# Patient Record
Sex: Male | Born: 1955 | Race: Black or African American | Hispanic: No | Smoking: Former smoker
Health system: Southern US, Community
[De-identification: ages and names within clinical notes are randomized; demographics above are authoritative.]

## PROBLEM LIST (undated history)

## (undated) DIAGNOSIS — D649 Anemia, unspecified: Secondary | ICD-10-CM

## (undated) DIAGNOSIS — J449 Chronic obstructive pulmonary disease, unspecified: Secondary | ICD-10-CM

## (undated) DIAGNOSIS — G629 Polyneuropathy, unspecified: Secondary | ICD-10-CM

## (undated) DIAGNOSIS — Z9981 Dependence on supplemental oxygen: Secondary | ICD-10-CM

## (undated) DIAGNOSIS — D696 Thrombocytopenia, unspecified: Secondary | ICD-10-CM

---

## 2009-01-09 ENCOUNTER — Emergency Department (HOSPITAL_COMMUNITY): Admission: EM | Admit: 2009-01-09 | Discharge: 2009-01-09 | Payer: Self-pay | Admitting: Emergency Medicine

## 2013-08-31 ENCOUNTER — Emergency Department (HOSPITAL_COMMUNITY): Payer: Self-pay

## 2013-08-31 ENCOUNTER — Encounter (HOSPITAL_COMMUNITY): Payer: Self-pay | Admitting: Emergency Medicine

## 2013-08-31 ENCOUNTER — Emergency Department (HOSPITAL_COMMUNITY)
Admission: EM | Admit: 2013-08-31 | Discharge: 2013-08-31 | Disposition: A | Discharge status: Home / Self Care | Payer: Self-pay | Attending: Emergency Medicine | Admitting: Emergency Medicine

## 2013-08-31 DIAGNOSIS — F17200 Nicotine dependence, unspecified, uncomplicated: Secondary | ICD-10-CM | POA: Insufficient documentation

## 2013-08-31 DIAGNOSIS — J9801 Acute bronchospasm: Secondary | ICD-10-CM

## 2013-08-31 DIAGNOSIS — J449 Chronic obstructive pulmonary disease, unspecified: Secondary | ICD-10-CM

## 2013-08-31 DIAGNOSIS — J441 Chronic obstructive pulmonary disease with (acute) exacerbation: Secondary | ICD-10-CM | POA: Insufficient documentation

## 2013-08-31 LAB — BASIC METABOLIC PANEL
BUN: 17 mg/dL (ref 6–23)
CALCIUM: 9.4 mg/dL (ref 8.4–10.5)
CO2: 31 meq/L (ref 19–32)
CREATININE: 1.04 mg/dL (ref 0.50–1.35)
Chloride: 104 mEq/L (ref 96–112)
GFR calc Af Amer: 90 mL/min — ABNORMAL LOW (ref >90)
GFR, EST NON AFRICAN AMERICAN: 77 mL/min — AB (ref >90)
GLUCOSE: 138 mg/dL — AB (ref 70–99)
Potassium: 4.2 mEq/L (ref 3.7–5.3)
SODIUM: 145 meq/L (ref 137–147)

## 2013-08-31 LAB — CBC WITH DIFFERENTIAL/PLATELET
BASOS ABS: 0 10*3/uL (ref 0.0–0.1)
BASOS PCT: 1 % (ref 0–1)
EOS ABS: 0.3 10*3/uL (ref 0.0–0.7)
Eosinophils Relative: 5 % (ref 0–5)
HCT: 39 % (ref 39.0–52.0)
HEMOGLOBIN: 13.6 g/dL (ref 13.0–17.0)
Lymphocytes Relative: 43 % (ref 12–46)
Lymphs Abs: 2.2 10*3/uL (ref 0.7–4.0)
MCH: 28 pg (ref 26.0–34.0)
MCHC: 34.9 g/dL (ref 30.0–36.0)
MCV: 80.2 fL (ref 78.0–100.0)
MONO ABS: 0.2 10*3/uL (ref 0.1–1.0)
MONOS PCT: 5 % (ref 3–12)
NEUTROS ABS: 2.5 10*3/uL (ref 1.7–7.7)
NEUTROS PCT: 47 % (ref 43–77)
Platelets: 154 10*3/uL (ref 150–400)
RBC: 4.86 MIL/uL (ref 4.22–5.81)
RDW: 15.1 % (ref 11.5–15.5)
WBC: 5.2 10*3/uL (ref 4.0–10.5)

## 2013-08-31 LAB — PRO B NATRIURETIC PEPTIDE: Pro B Natriuretic peptide (BNP): 41.2 pg/mL (ref 0–125)

## 2013-08-31 LAB — TROPONIN I

## 2013-08-31 MED ORDER — ALBUTEROL (5 MG/ML) CONTINUOUS INHALATION SOLN
15.0000 mg/h | INHALATION_SOLUTION | Freq: Once | RESPIRATORY_TRACT | Status: AC
  Administered 2013-08-31: 15 mg/h via RESPIRATORY_TRACT
  Filled 2013-08-31: qty 20

## 2013-08-31 MED ORDER — ALBUTEROL SULFATE (2.5 MG/3ML) 0.083% IN NEBU
2.5000 mg | INHALATION_SOLUTION | Freq: Once | RESPIRATORY_TRACT | Status: AC
  Administered 2013-08-31: 2.5 mg via RESPIRATORY_TRACT

## 2013-08-31 MED ORDER — ALBUTEROL SULFATE HFA 108 (90 BASE) MCG/ACT IN AERS
2.0000 | INHALATION_SPRAY | RESPIRATORY_TRACT | Status: DC | PRN
  Administered 2013-08-31: 2 via RESPIRATORY_TRACT

## 2013-08-31 MED ORDER — AEROCHAMBER Z-STAT PLUS/MEDIUM MISC
1.0000 | Freq: Once | Status: AC
Start: 2013-08-31 — End: 2013-08-31
  Administered 2013-08-31: 1

## 2013-08-31 MED ORDER — METHYLPREDNISOLONE SODIUM SUCC 125 MG IJ SOLR
125.0000 mg | Freq: Once | INTRAMUSCULAR | Status: AC
  Administered 2013-08-31: 125 mg via INTRAVENOUS
  Filled 2013-08-31: qty 2

## 2013-08-31 MED ORDER — ALBUTEROL SULFATE (2.5 MG/3ML) 0.083% IN NEBU
INHALATION_SOLUTION | RESPIRATORY_TRACT | Status: AC
  Filled 2013-08-31: qty 3

## 2013-08-31 MED ORDER — ALBUTEROL SULFATE HFA 108 (90 BASE) MCG/ACT IN AERS
INHALATION_SPRAY | RESPIRATORY_TRACT | Status: AC
  Filled 2013-08-31: qty 6.7

## 2013-08-31 MED ORDER — SODIUM CHLORIDE 0.9 % IV SOLN
INTRAVENOUS | Status: DC
  Administered 2013-08-31: 14:00:00 via INTRAVENOUS

## 2013-08-31 MED ORDER — IPRATROPIUM-ALBUTEROL 0.5-2.5 (3) MG/3ML IN SOLN
3.0000 mL | Freq: Once | RESPIRATORY_TRACT | Status: AC
  Administered 2013-08-31: 3 mL via RESPIRATORY_TRACT

## 2013-08-31 MED ORDER — ALBUTEROL SULFATE HFA 108 (90 BASE) MCG/ACT IN AERS: 2.0000 | INHALATION_SPRAY | Freq: Four times a day (QID) | RESPIRATORY_TRACT | Status: DC | PRN

## 2013-08-31 MED ORDER — IPRATROPIUM-ALBUTEROL 0.5-2.5 (3) MG/3ML IN SOLN
RESPIRATORY_TRACT | Status: AC
  Filled 2013-08-31: qty 3

## 2013-08-31 MED ORDER — PREDNISONE 20 MG PO TABS: ORAL_TABLET | ORAL | Status: DC

## 2013-08-31 MED ORDER — IPRATROPIUM BROMIDE 0.02 % IN SOLN
0.5000 mg | Freq: Once | RESPIRATORY_TRACT | Status: AC
  Administered 2013-08-31: 0.5 mg via RESPIRATORY_TRACT
  Filled 2013-08-31: qty 2.5

## 2013-08-31 NOTE — Discharge Instructions (Signed)
Try to stop smoking! Use the inhaler for wheezing or shortness of breath. Call the [facility] Department to be seen.

## 2013-08-31 NOTE — ED Notes (Signed)
Pt ambulated around nurse's station, O2 saturation 90%-93% on ra. No sob. Pt states he is feeling a lot better. Nad noted.

## 2013-08-31 NOTE — ED Provider Notes (Signed)
CSN: [id number]     Arrival date & time 08/31/13  1245 History  This chart was scribed for Janice Norrie, MD by Rolanda Lundborg, ED Scribe. This patient was seen in room APA19/APA19 and the patient's care was started at 12:54 PM.    Chief Complaint  Patient presents with  . Shortness of Breath   The history is provided by the patient. No language interpreter was used.   HPI Comments: ARISTEO HANKERSON is a 58 y.o. male who presents to the Emergency Department complaining of SOB onset 11am this morning after he started sweeping up dust/shavings at his work. He states he sweeps regularly and has never had this problem. He states he wheezes at baseline. He does not take breathing treatments. He states he slows down to help his wheezing. He reports a dry cough early in the mornings. Pt states he feels he is breathing a little easier after receiving a breathing treatment here but still not back to normal. He states he has a h/o SOB but has never been diagnosed with lung disease. He states he started smoking at an early age and worked around asbestos. Pt has only been seen by a doctor twice in the past 8-10 years.   He smokes 1 PPD. He drinks 2-3 liquor drinks every other day.  PCP none  History reviewed. No pertinent past medical history. History reviewed. No pertinent past surgical history. No family history on file. History  Substance Use Topics  . Smoking status: Current Every Day Smoker    Types: Cigarettes  . Smokeless tobacco: Not on file  . Alcohol Use: Not on file     Comment: Occ  self employed Smokes 1 ppd Drinks liquor every other day or weekends  Review of Systems  Respiratory: Positive for cough, shortness of breath and wheezing.   All other systems reviewed and are negative.      Allergies  Review of patient's allergies indicates no known allergies.  Home Medications  No current outpatient prescriptions on file.    BP 164/117  Pulse 80  Temp(Src) 97.7 F (36.5  C) (Oral)  Resp 16  Ht 5\' 6"  (1.676 m)  Wt 150 lb (68.04 kg)  BMI 24.22 kg/m2  SpO2 89 %    Vital signs normal except hypertension and hypoxia  Physical Exam  Nursing note and vitals reviewed. Constitutional: He is oriented to person, place, and time. He appears well-developed and well-nourished.  Non-toxic appearance. He does not appear ill. No distress.  Pt examined after his first nebulizer  HENT:  Head: Normocephalic and atraumatic.  Right Ear: External ear normal.  Left Ear: External ear normal.  Nose: Nose normal. No mucosal edema or rhinorrhea.  Mouth/Throat: Oropharynx is clear and moist and mucous membranes are normal. No dental abscesses or uvula swelling.  Eyes: Conjunctivae and EOM are normal. Pupils are equal, round, and reactive to light.  Neck: Normal range of motion and full passive range of motion without pain. Neck supple.  Cardiovascular: Normal rate, regular rhythm and normal heart sounds.  Exam reveals no gallop and no friction rub.   No murmur heard. Pulmonary/Chest: Accessory muscle usage present. Tachypnea noted. He is in respiratory distress. He has decreased breath sounds. He has wheezes (diffuse high pitched). He has no rhonchi. He has no rales. He exhibits no tenderness and no crepitus.  Retractions. Audible wheezing.   Abdominal: Soft. Normal appearance and bowel sounds are normal. He exhibits no distension. There is no tenderness. There  is no rebound and no guarding.  Musculoskeletal: Normal range of motion. He exhibits no edema and no tenderness.  Moves all extremities well.   Neurological: He is alert and oriented to person, place, and time. He has normal strength. No cranial nerve deficit.  Skin: Skin is warm, dry and intact. No rash noted. No erythema. No pallor.  Psychiatric: He has a normal mood and affect. His speech is normal and behavior is normal. His mood appears not anxious.    ED Course  Procedures (including critical care  time) Medications  ipratropium-albuterol (DUONEB) 0.5-2.5 (3) MG/3ML nebulizer solution (  Not Given 08/31/13 1301)  albuterol (PROVENTIL) (2.5 MG/3ML) 0.083% nebulizer solution (  Not Given 08/31/13 1302)  0.9 %  sodium chloride infusion ( Intravenous New Bag/Given 08/31/13 1337)  albuterol (PROVENTIL HFA;VENTOLIN HFA) 108 (90 BASE) MCG/ACT inhaler 2 puff (not administered)  aerochamber Z-Stat Plus/medium 1 each (not administered)  ipratropium-albuterol (DUONEB) 0.5-2.5 (3) MG/3ML nebulizer solution 3 mL (3 mLs Nebulization Given 08/31/13 1301)  albuterol (PROVENTIL) (2.5 MG/3ML) 0.083% nebulizer solution 2.5 mg (2.5 mg Nebulization Given 08/31/13 1301)  albuterol (PROVENTIL,VENTOLIN) solution continuous neb (15 mg/hr Nebulization Given 08/31/13 1337)  ipratropium (ATROVENT) nebulizer solution 0.5 mg (0.5 mg Nebulization Given 08/31/13 1337)  methylPREDNISolone sodium succinate (SOLU-MEDROL) 125 mg/2 mL injection 125 mg (125 mg Intravenous Given 08/31/13 1337)    DIAGNOSTIC STUDIES: Oxygen Saturation is 96% on [place], low by my interpretation.    COORDINATION OF CARE: 1:20 PM- Discussed treatment plan with pt which includes CXR. Discussed possibility for admission. Pt agrees to plan.  2:12 PM- Rechecked pt who is in the middle of a breathing treatment. States he feels it is helping.  Recheck at 1520 patient has finished his continuous nebulizer. His pulse ox was 91% on room air and improved to 93% on deep breathing. His lungs were now clear. He states he feels much improved.  The patient was angulated by nursing staff and his pulse ox remained 90-93% on room air. He denied that it made him feel worse. Patient was given option to stay and be admitted however he wants to try to go home. We also discussed stopping smoking.  Labs Review Results for orders placed during the hospital encounter of 08/31/13  CBC WITH DIFFERENTIAL      Result Value Ref Range   WBC 5.2  4.0 - 10.5 K/uL   RBC 4.86  4.22 - 5.81  MIL/uL   Hemoglobin 13.6  13.0 - 17.0 g/dL   HCT 39.0  39.0 - 52.0 %   MCV 80.2  78.0 - 100.0 fL   MCH 28.0  26.0 - 34.0 pg   MCHC 34.9  30.0 - 36.0 g/dL   RDW 15.1  11.5 - 15.5 %   Platelets 154  150 - 400 K/uL   Neutrophils Relative % 47  43 - 77 %   Neutro Abs 2.5  1.7 - 7.7 K/uL   Lymphocytes Relative 43  12 - 46 %   Lymphs Abs 2.2  0.7 - 4.0 K/uL   Monocytes Relative 5  3 - 12 %   Monocytes Absolute 0.2  0.1 - 1.0 K/uL   Eosinophils Relative 5  0 - 5 %   Eosinophils Absolute 0.3  0.0 - 0.7 K/uL   Basophils Relative 1  0 - 1 %   Basophils Absolute 0.0  0.0 - 0.1 K/uL  PRO B NATRIURETIC PEPTIDE      Result Value Ref Range   Pro  B Natriuretic peptide (BNP) 41.2  0 - 125 pg/mL  TROPONIN I      Result Value Ref Range   Troponin I <0.30  <0.30 ng/mL  BASIC METABOLIC PANEL      Result Value Ref Range   Sodium 145  137 - 147 mEq/L   Potassium 4.2  3.7 - 5.3 mEq/L   Chloride 104  96 - 112 mEq/L   CO2 31  19 - 32 mEq/L   Glucose, Bld 138 (*) 70 - 99 mg/dL   BUN 17  6 - 23 mg/dL   Creatinine, Ser 1.04  0.50 - 1.35 mg/dL   Calcium 9.4  8.4 - 10.5 mg/dL   GFR calc non Af Amer 77 (*) >90 mL/min   GFR calc Af Amer 90 (*) >90 mL/min   Laboratory interpretation all normal   Imaging Review Dg Chest Portable 1 View  08/31/2013   CLINICAL DATA:  Shortness of breath  EXAM: PORTABLE CHEST - 1 VIEW  COMPARISON:  None.  FINDINGS: The lungs are mildly hyperinflated. There is no focal infiltrate. The cardiac silhouette is normal in size. The pulmonary vascularity is not engorged. The mediastinum is normal in width. There is no pleural effusion. The observed portions of the bony thorax appear normal.  IMPRESSION: There is hyperinflation consistent with COPD or reactive airway disease. There is no evidence of pneumonia nor CHF.   Electronically Signed   By: David  Martinique   On: 08/31/2013 14:12     EKG Interpretation   Date/Time:  Tuesday August 31 2013 12:57:29 EDT Ventricular Rate:  79 PR  Interval:  128 QRS Duration: 86 QT Interval:  376 QTC Calculation: 431 R Axis:   77 Text Interpretation:  Normal sinus rhythm Cannot rule out Anterior infarct  , age undetermined No previous ECGs available Confirmed by Reyaansh Merlo  MD-I,  Ahmed Inniss ([id number]) on 08/31/2013 1:01:09 PM      MDM   Final diagnoses:  Bronchospasm, acute  COPD (chronic obstructive pulmonary disease)    New Prescriptions   ALBUTEROL (PROVENTIL HFA;VENTOLIN HFA) 108 (90 BASE) MCG/ACT INHALER    Inhale 2 puffs into the lungs every 6 (six) hours as needed for wheezing or shortness of breath.   PREDNISONE (DELTASONE) 20 MG TABLET    Take 3 po QD x 2d starting tomorrow, then 2 po QD x 3d then 1 po QD x 3d    Plan discharge  Rolland Porter, MD, FACEP   I personally performed the services described in this documentation, which was scribed in my presence. The recorded information has been reviewed and considered.  Rolland Porter, MD, Abram Sander   Janice Norrie, MD 08/31/13 [phone]

## 2013-08-31 NOTE — ED Notes (Signed)
Pt states he was sweeping up some dust and became SOB. SOB first began 2 hours PTA. Audible wheezing. RA O2 of 89%

## 2013-08-31 NOTE — Care Management Note (Signed)
ED/CM noted patient did not have health insurance and/or PCP listed in the computer.  Patient was given the [place] resource handout with information on the clinics, food pantries, and the handout for new health insurance sign-up.  Patient expressed appreciation for information received. 

## 2013-11-22 ENCOUNTER — Emergency Department (HOSPITAL_COMMUNITY): Payer: Self-pay

## 2013-11-22 ENCOUNTER — Emergency Department (HOSPITAL_COMMUNITY)
Admission: EM | Admit: 2013-11-22 | Discharge: 2013-11-22 | Disposition: A | Discharge status: Home / Self Care | Payer: Self-pay | Attending: Emergency Medicine | Admitting: Emergency Medicine

## 2013-11-22 ENCOUNTER — Encounter (HOSPITAL_COMMUNITY): Payer: Self-pay | Admitting: Emergency Medicine

## 2013-11-22 DIAGNOSIS — Y929 Unspecified place or not applicable: Secondary | ICD-10-CM | POA: Insufficient documentation

## 2013-11-22 DIAGNOSIS — Y9389 Activity, other specified: Secondary | ICD-10-CM | POA: Insufficient documentation

## 2013-11-22 DIAGNOSIS — S83106A Unspecified dislocation of unspecified knee, initial encounter: Secondary | ICD-10-CM | POA: Insufficient documentation

## 2013-11-22 DIAGNOSIS — F17200 Nicotine dependence, unspecified, uncomplicated: Secondary | ICD-10-CM | POA: Insufficient documentation

## 2013-11-22 DIAGNOSIS — S83105A Unspecified dislocation of left knee, initial encounter: Secondary | ICD-10-CM

## 2013-11-22 DIAGNOSIS — W64XXXA Exposure to other animate mechanical forces, initial encounter: Secondary | ICD-10-CM | POA: Insufficient documentation

## 2013-11-22 MED ORDER — OXYCODONE-ACETAMINOPHEN 5-325 MG PO TABS: 2.0000 | ORAL_TABLET | ORAL | Status: DC | PRN

## 2013-11-22 MED ORDER — IBUPROFEN 800 MG PO TABS
800.0000 mg | ORAL_TABLET | Freq: Once | ORAL | Status: AC
  Administered 2013-11-22: 800 mg via ORAL
  Filled 2013-11-22: qty 1

## 2013-11-22 MED ORDER — OXYCODONE-ACETAMINOPHEN 5-325 MG PO TABS
2.0000 | ORAL_TABLET | Freq: Once | ORAL | Status: AC
  Administered 2013-11-22: 2 via ORAL
  Filled 2013-11-22: qty 2

## 2013-11-22 NOTE — ED Notes (Signed)
Pt continues to c/o pain in left knee.  Ice pack applied and medicated for pain.

## 2013-11-22 NOTE — ED Notes (Signed)
Pt states a horse was spooked while he was fixing a fence on Saturday. Pt states he was hit with the horses body and has had swelling and pain to left knee since. Pt states the knee was out of socket and he put it back in.

## 2013-11-22 NOTE — ED Provider Notes (Signed)
CSN: [id number]     Arrival date & time 11/22/13  1355 History  This chart was scribed for Shaune Pollack, MD,  by Stacy Gardner, ED Scribe. The patient was seen in room APA07/APA07 and the patient's care was started at 5:42 PM.   First MD Initiated Contact with Patient 11/22/13 1735     Chief Complaint  Patient presents with  . Knee Injury     (Consider location/radiation/quality/duration/timing/severity/associated sxs/prior Treatment) Patient is a 58 y.o. male presenting with knee pain. The history is provided by the patient and medical records. No language interpreter was used.  Knee Pain Location:  Knee Time since incident:  2 days Injury: yes   Knee location:  L knee Pain details:    Radiates to:  Does not radiate   Severity:  Moderate   Onset quality:  Sudden   Duration:  2 days   Timing:  Constant   Progression:  Unchanged Chronicity:  New Dislocation: yes   Foreign body present:  No foreign bodies Relieved by:  Nothing Worsened by:  Activity, extension, abduction, bearing weight and rotation Ineffective treatments:  Immobilization and NSAIDs Associated symptoms: swelling   Associated symptoms: no numbness and no tingling    HPI Comments: YUVAL RUBENS is a 58 y.o. male who presents to the Emergency Department complaining of left knee pain with swelling for the past two days. Pt states that his knee was struck by a startled horse's head while painting a fence. His knee rotated medially and dislocated. Pt was able to relocated his knee manually. He tried taking Aleve and resting his knee but that does not seem to help. Denies foot pain. Denies paraesthesia.    History reviewed. No pertinent past medical history. History reviewed. No pertinent past surgical history. No family history on file. History  Substance Use Topics  . Smoking status: Current Every Day Smoker    Types: Cigarettes  . Smokeless tobacco: Not on file  . Alcohol Use: Yes     Comment: Occ     Review of Systems  Musculoskeletal: Positive for arthralgias, gait problem, joint swelling and myalgias.  All other systems reviewed and are negative.     Allergies  Review of patient's allergies indicates no known allergies.  Home Medications   Prior to Admission medications   Medication Sig Start Date End Date Taking? Authorizing Hessie Varone  albuterol (PROVENTIL HFA;VENTOLIN HFA) 108 (90 BASE) MCG/ACT inhaler Inhale 2 puffs into the lungs every 6 (six) hours as needed for wheezing or shortness of breath. 08/31/13   Janice Norrie, MD  naproxen sodium (ALEVE) 220 MG tablet Take 440 mg by mouth daily as needed. pain    Historical Johnwilliam Shepperson, MD  predniSONE (DELTASONE) 20 MG tablet Take 3 po QD x 2d starting tomorrow, then 2 po QD x 3d then 1 po QD x 3d 08/31/13   Janice Norrie, MD   BP 137/91  Pulse 102  Temp(Src) 98.3 F (36.8 C) (Oral)  Resp 20  Ht 5\' 3"  (1.6 m)  Wt 135 lb (61.236 kg)  BMI 23.92 kg/m2  SpO2 97% Physical Exam  Nursing note and vitals reviewed. Constitutional: He is oriented to person, place, and time. He appears well-developed and well-nourished. No distress.  HENT:  Head: Normocephalic and atraumatic.  Eyes: EOM are normal. Pupils are equal, round, and reactive to light.  Neck: Normal range of motion. Neck supple. No tracheal deviation present.  Cardiovascular: Normal rate.   Pulmonary/Chest: Effort normal. No respiratory  distress.  Abdominal: Soft. He exhibits no distension.  Musculoskeletal: He exhibits edema and tenderness.       Left knee: He exhibits swelling and effusion. Tenderness found.  Left knee: Moderate swelling, effusion and warmth. No point tenderness. 2+ DP and tibal pulse on left. Good sensation. Normal ankle joint. Normal hip.   Neurological: He is alert and oriented to person, place, and time.  Skin: Skin is warm and dry.  Psychiatric: He has a normal mood and affect. His behavior is normal.    ED Course  Procedures (including critical  care time) DIAGNOSTIC STUDIES: Oxygen Saturation is 97% on room air, normal by my interpretation.    COORDINATION OF CARE:  5:48 PM Discussed course of care with pt which includes the result of the left knee x-ray. Pt understands and agrees. 7:36 PM Discussed course of care with pt which includes knee immobilizer and to follow up with Dr. Aline Brochure. Pt understands and agrees.  Labs Review Labs Reviewed - No data to display  Imaging Review Dg Knee Complete 4 Views Left  11/22/2013   CLINICAL DATA:  Status post knee injury on Saturday.  EXAM: LEFT KNEE - COMPLETE 4+ VIEW  COMPARISON:  None.  FINDINGS: There is no evidence of fracture, dislocation. There is a moderate suprapatellar effusion. 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 Soft tissues are unremarkable.  IMPRESSION: No acute fracture or dislocation.  Moderate suprapatellar effusion.   Electronically Signed   By: Abelardo Diesel M.D.   On: 11/22/2013 15:35     EKG Interpretation None      MDM   Final diagnoses:  Left knee dislocation, initial encounter   58 year old male who by history he appears to had a knee dislocation that he self reduced. This was 48 hours ago. His exam today reveals that he has intact pulses and sensation distal to the injury. The knee is diffusely swollen and mildly tender. He may have some ligamentous laxity. X-ray does not show any bony abnormality. The patient's care was discussed with Dr. Ples Specter. Plan is made in concurrence with his consultation. He will see the patient in the office in the morning for pulse checks. The patient is to be placed in a knee immobilizer and will be on crutches. He'll be given pain medicine for use overnight. Patient voices understanding of plan and return precautions I personally performed the services described in this documentation, which was scribed in my presence. The recorded information has been reviewed and considered.   Shaune Pollack, MD 11/22/13 [phone]

## 2013-11-23 ENCOUNTER — Ambulatory Visit (INDEPENDENT_AMBULATORY_CARE_PROVIDER_SITE_OTHER): Payer: Self-pay | Admitting: Orthopedic Surgery

## 2013-11-23 VITALS — BP 140/94 | Ht 66.0 in | Wt 152.0 lb

## 2013-11-23 DIAGNOSIS — S8390XA Sprain of unspecified site of unspecified knee, initial encounter: Secondary | ICD-10-CM | POA: Insufficient documentation

## 2013-11-23 DIAGNOSIS — IMO0002 Reserved for concepts with insufficient information to code with codable children: Secondary | ICD-10-CM

## 2013-11-23 DIAGNOSIS — S8392XA Sprain of unspecified site of left knee, initial encounter: Secondary | ICD-10-CM

## 2013-11-23 NOTE — Progress Notes (Signed)
Patient ID: Marvin Davis, male   DOB: 1956-03-18, 58 y.o.   MRN: [id number]  Chief Complaint  Patient presents with  . Knee Problem    ER follow up, dislocation of left knee, DOI 11/20/13    58 years old uninsured injured his left knee yesterday when the horse turned quickly and hit him in the leg. He says his left knee was deformed he should get back in place and presented to the ER several hours later. He had x-rays there is no fracture. He had a presumed dislocation of the knee however it seems more like a collateral ligament/anterior cruciate ligament type injury. He is neurovascularly intact throughout his course and presents here for per the orthopedic evaluation and treatment. He was braced. Review of systems is negative  No past medical history on file. No past surgical history on file. History  Substance Use Topics  . Smoking status: Current Every Day Smoker    Types: Cigarettes  . Smokeless tobacco: Not on file  . Alcohol Use: Yes     Comment: Occ   No family history on file.  Vital signs: BP 140/94  Ht 5\' 6"  (1.676 m)  Wt 152 lb (68.947 kg)  BMI 24.55 kg/m2   General the patient is well-developed and well-nourished grooming and hygiene are normal Oriented x3 Mood and affect normal Ambulation requires crutches  Inspection of the left knee large joint effusion, 120 cc of blood The range of motion is limited to approximately 25 of flexion. Has tenderness over the medial femoral condyle and pain with the anterior Lachman test and laxity is noted mild. No collateral ligament laxity was noted. Motor exam was normal skin was clean dry and intact pulses were intact peripheral capillary refill is normal and there are no sensory deficits  Right knee Inspection was normal Full range of motion All joints are stable Motor exam is normal Skin clean dry and intact  Cardiovascular exam is normal Sensory exam normal  Knee  Injection and aspiration Procedure  Note  Pre-operative Diagnosis: left knee oa, effusion  Post-operative Diagnosis: same  Indications: pain, swelling  Anesthesia: ethyl chloride   Procedure Details   Verbal consent was obtained for the procedure. Time out was completed.The joint was prepped with alcohol, followed by  Ethyl chloride spray and The 18-gauge needle was inserted into the joint via lateral approach and we aspirated approximately 120 cc of blood was withdrawn.  Complications:  None; patient tolerated the procedure well.  Weight-bear as tolerated in brace for the next 3 weeks Apply ice as instructed Return reevaluation possible hinged brace

## 2013-11-23 NOTE — Patient Instructions (Signed)
Cryotherapy Cryotherapy is when you put ice on your injury. Ice helps lessen pain and puffiness (swelling) after an injury. Ice works the best when you start using it in the first 24 to 48 hours after an injury. HOME CARE  Put a dry or damp towel between the ice pack and your skin.  You may press gently on the ice pack.  Leave the ice on for no more than 10 to 20 minutes at a time.  Check your skin after 5 minutes to make sure your skin is okay.  Rest at least 20 minutes between ice pack uses.  Stop using ice when your skin loses feeling (numbness).  Do not use ice on someone who cannot tell you when it hurts. This includes small children and people with memory problems (dementia). GET HELP RIGHT AWAY IF:  You have white spots on your skin.  Your skin turns blue or pale.  Your skin feels waxy or hard.  Your puffiness gets worse. MAKE SURE YOU:   Understand these instructions.  Will watch your condition.  Will get help right away if you are not doing well or get worse. Document Released: 10/30/2007 Document Revised: 08/05/2011 Document Reviewed: 01/03/2011 [employer] Patient Information 2015 [employer]. This information is not intended to replace advice given to you by your health care provider. Make sure you discuss any questions you have with your health care provider.  

## 2013-12-16 ENCOUNTER — Ambulatory Visit: Payer: Self-pay | Admitting: Orthopedic Surgery

## 2013-12-16 ENCOUNTER — Encounter: Payer: Self-pay | Admitting: Orthopedic Surgery

## 2016-06-17 ENCOUNTER — Encounter (HOSPITAL_COMMUNITY): Payer: Self-pay | Admitting: Emergency Medicine

## 2016-06-17 ENCOUNTER — Inpatient Hospital Stay (HOSPITAL_COMMUNITY)
Admission: EM | Admit: 2016-06-17 | Discharge: 2016-06-21 | DRG: 190 | Disposition: A | Discharge status: Home / Self Care | Payer: Self-pay | Attending: Internal Medicine | Admitting: Internal Medicine

## 2016-06-17 ENCOUNTER — Other Ambulatory Visit: Payer: Self-pay

## 2016-06-17 ENCOUNTER — Emergency Department (HOSPITAL_COMMUNITY): Payer: Self-pay

## 2016-06-17 DIAGNOSIS — D696 Thrombocytopenia, unspecified: Secondary | ICD-10-CM | POA: Diagnosis present

## 2016-06-17 DIAGNOSIS — F17210 Nicotine dependence, cigarettes, uncomplicated: Secondary | ICD-10-CM | POA: Diagnosis present

## 2016-06-17 DIAGNOSIS — I10 Essential (primary) hypertension: Secondary | ICD-10-CM

## 2016-06-17 DIAGNOSIS — R600 Localized edema: Secondary | ICD-10-CM | POA: Diagnosis present

## 2016-06-17 DIAGNOSIS — R0600 Dyspnea, unspecified: Secondary | ICD-10-CM | POA: Diagnosis present

## 2016-06-17 DIAGNOSIS — I5033 Acute on chronic diastolic (congestive) heart failure: Secondary | ICD-10-CM | POA: Diagnosis present

## 2016-06-17 DIAGNOSIS — J9601 Acute respiratory failure with hypoxia: Secondary | ICD-10-CM | POA: Diagnosis present

## 2016-06-17 DIAGNOSIS — Z139 Encounter for screening, unspecified: Secondary | ICD-10-CM

## 2016-06-17 DIAGNOSIS — J449 Chronic obstructive pulmonary disease, unspecified: Secondary | ICD-10-CM

## 2016-06-17 DIAGNOSIS — J9801 Acute bronchospasm: Secondary | ICD-10-CM

## 2016-06-17 DIAGNOSIS — R0602 Shortness of breath: Secondary | ICD-10-CM

## 2016-06-17 DIAGNOSIS — I110 Hypertensive heart disease with heart failure: Secondary | ICD-10-CM | POA: Diagnosis present

## 2016-06-17 DIAGNOSIS — J439 Emphysema, unspecified: Principal | ICD-10-CM | POA: Diagnosis present

## 2016-06-17 DIAGNOSIS — R0902 Hypoxemia: Secondary | ICD-10-CM

## 2016-06-17 HISTORY — DX: Chronic obstructive pulmonary disease, unspecified: J44.9

## 2016-06-17 LAB — CBC
HEMATOCRIT: 43.3 % (ref 39.0–52.0)
Hemoglobin: 14.7 g/dL (ref 13.0–17.0)
MCH: 27.8 pg (ref 26.0–34.0)
MCHC: 33.9 g/dL (ref 30.0–36.0)
MCV: 81.9 fL (ref 78.0–100.0)
Platelets: 106 10*3/uL — ABNORMAL LOW (ref 150–400)
RBC: 5.29 MIL/uL (ref 4.22–5.81)
RDW: 15.4 % (ref 11.5–15.5)
WBC: 4.5 10*3/uL (ref 4.0–10.5)

## 2016-06-17 LAB — BRAIN NATRIURETIC PEPTIDE: B NATRIURETIC PEPTIDE 5: 30 pg/mL (ref 0.0–100.0)

## 2016-06-17 LAB — TSH: TSH: 1.279 u[IU]/mL (ref 0.350–4.500)

## 2016-06-17 LAB — BASIC METABOLIC PANEL
Anion gap: 5 (ref 5–15)
BUN: 17 mg/dL (ref 6–20)
CO2: 34 mmol/L — ABNORMAL HIGH (ref 22–32)
CREATININE: 1.03 mg/dL (ref 0.61–1.24)
Calcium: 9.1 mg/dL (ref 8.9–10.3)
Chloride: 102 mmol/L (ref 101–111)
GFR calc Af Amer: >60 mL/min (ref >60)
Glucose, Bld: 116 mg/dL — ABNORMAL HIGH (ref 65–99)
POTASSIUM: 4.2 mmol/L (ref 3.5–5.1)
Sodium: 141 mmol/L (ref 135–145)

## 2016-06-17 LAB — TROPONIN I: Troponin I: <0.03 ng/mL (ref <0.03)

## 2016-06-17 LAB — GLUCOSE, POCT (MANUAL RESULT ENTRY): POC Glucose: 125 mg/dl — AB (ref 70–99)

## 2016-06-17 MED ORDER — SODIUM CHLORIDE 0.9 % IV SOLN: 250.0000 mL | INTRAVENOUS | Status: DC | PRN

## 2016-06-17 MED ORDER — METHYLPREDNISOLONE SODIUM SUCC 125 MG IJ SOLR
125.0000 mg | Freq: Once | INTRAMUSCULAR | Status: AC
  Administered 2016-06-17: 125 mg via INTRAVENOUS
  Filled 2016-06-17: qty 2

## 2016-06-17 MED ORDER — ASPIRIN EC 81 MG PO TBEC
81.0000 mg | DELAYED_RELEASE_TABLET | Freq: Every day | ORAL | Status: DC
  Administered 2016-06-18 – 2016-06-21 (×4): 81 mg via ORAL
  Filled 2016-06-17 (×4): qty 1

## 2016-06-17 MED ORDER — METHYLPREDNISOLONE SODIUM SUCC 125 MG IJ SOLR
80.0000 mg | Freq: Two times a day (BID) | INTRAMUSCULAR | Status: DC
  Administered 2016-06-18 – 2016-06-21 (×7): 80 mg via INTRAVENOUS
  Filled 2016-06-17 (×7): qty 2

## 2016-06-17 MED ORDER — ALBUTEROL (5 MG/ML) CONTINUOUS INHALATION SOLN
10.0000 mg/h | INHALATION_SOLUTION | Freq: Once | RESPIRATORY_TRACT | Status: AC
  Administered 2016-06-17: 10 mg/h via RESPIRATORY_TRACT
  Filled 2016-06-17: qty 20

## 2016-06-17 MED ORDER — ALBUTEROL SULFATE (2.5 MG/3ML) 0.083% IN NEBU
5.0000 mg | INHALATION_SOLUTION | Freq: Once | RESPIRATORY_TRACT | Status: AC
  Administered 2016-06-17: 5 mg via RESPIRATORY_TRACT
  Filled 2016-06-17: qty 6

## 2016-06-17 MED ORDER — AZITHROMYCIN 250 MG PO TABS
500.0000 mg | ORAL_TABLET | Freq: Every day | ORAL | Status: AC
  Administered 2016-06-18: 500 mg via ORAL
  Filled 2016-06-17: qty 2

## 2016-06-17 MED ORDER — ALBUTEROL SULFATE (2.5 MG/3ML) 0.083% IN NEBU: 2.5000 mg | INHALATION_SOLUTION | RESPIRATORY_TRACT | Status: DC | PRN

## 2016-06-17 MED ORDER — IPRATROPIUM BROMIDE 0.02 % IN SOLN
1.0000 mg | Freq: Once | RESPIRATORY_TRACT | Status: AC
  Administered 2016-06-17: 1 mg via RESPIRATORY_TRACT
  Filled 2016-06-17: qty 5

## 2016-06-17 MED ORDER — IPRATROPIUM BROMIDE 0.02 % IN SOLN: 0.5000 mg | Freq: Four times a day (QID) | RESPIRATORY_TRACT | Status: DC

## 2016-06-17 MED ORDER — FUROSEMIDE 10 MG/ML IJ SOLN
20.0000 mg | Freq: Every day | INTRAMUSCULAR | Status: DC
  Administered 2016-06-17 – 2016-06-18 (×2): 20 mg via INTRAVENOUS
  Filled 2016-06-17 (×2): qty 2

## 2016-06-17 MED ORDER — AZITHROMYCIN 250 MG PO TABS
250.0000 mg | ORAL_TABLET | Freq: Every day | ORAL | Status: DC
  Administered 2016-06-19 – 2016-06-21 (×3): 250 mg via ORAL
  Filled 2016-06-17 (×3): qty 1

## 2016-06-17 MED ORDER — SODIUM CHLORIDE 0.9% FLUSH: 3.0000 mL | INTRAVENOUS | Status: DC | PRN

## 2016-06-17 MED ORDER — IPRATROPIUM-ALBUTEROL 0.5-2.5 (3) MG/3ML IN SOLN
3.0000 mL | Freq: Four times a day (QID) | RESPIRATORY_TRACT | Status: DC
Start: 2016-06-17 — End: 2016-06-18
  Administered 2016-06-18 (×4): 3 mL via RESPIRATORY_TRACT
  Filled 2016-06-17 (×4): qty 3

## 2016-06-17 MED ORDER — SODIUM CHLORIDE 0.9% FLUSH
3.0000 mL | Freq: Two times a day (BID) | INTRAVENOUS | Status: DC
  Administered 2016-06-17 – 2016-06-21 (×8): 3 mL via INTRAVENOUS

## 2016-06-17 MED ORDER — ALBUTEROL SULFATE (2.5 MG/3ML) 0.083% IN NEBU: 2.5000 mg | INHALATION_SOLUTION | RESPIRATORY_TRACT | Status: DC

## 2016-06-17 MED ORDER — TRIAMTERENE-HCTZ 37.5-25 MG PO TABS
1.0000 | ORAL_TABLET | Freq: Every day | ORAL | Status: DC
  Administered 2016-06-18 – 2016-06-21 (×4): 1 via ORAL
  Filled 2016-06-17 (×4): qty 1

## 2016-06-17 MED ORDER — ALBUTEROL SULFATE (2.5 MG/3ML) 0.083% IN NEBU: 2.5000 mg | INHALATION_SOLUTION | Freq: Four times a day (QID) | RESPIRATORY_TRACT | Status: DC

## 2016-06-17 NOTE — ED Provider Notes (Signed)
[facility] DEPT Provider Note   CSN: [id number] Arrival date & time: 06/17/16  1420     History   Chief Complaint Chief Complaint  Patient presents with  . Shortness of Breath    HPI Marvin Davis is a 61 y.o. male.   Shortness of Breath    Pt was seen at 1705.  Per pt, c/o gradual onset and worsening of persistent cough, wheezing and SOB for the past 2+ months. Has been associated with increasing pedal edema bilat.  Denies CP/palpitations, no back pain, no abd pain, no N/V/D, no fevers, no rash.    History reviewed. No pertinent past medical history. Pt has not been to a doctor in "many years."  Patient Active Problem List   Diagnosis Date Noted  . Knee sprain 11/23/2013    History reviewed. No pertinent surgical history.     Home Medications    Prior to Admission medications   Medication Sig Start Date End Date Taking? Authorizing Provider  albuterol (PROVENTIL HFA;VENTOLIN HFA) 108 (90 BASE) MCG/ACT inhaler Inhale 2 puffs into the lungs every 6 (six) hours as needed for wheezing or shortness of breath. 08/31/13   Rolland Porter, MD  naproxen sodium (ALEVE) 220 MG tablet Take 440 mg by mouth daily as needed. pain    Historical Provider, MD  oxyCODONE-acetaminophen (PERCOCET/ROXICET) 5-325 MG per tablet Take 2 tablets by mouth every 4 (four) hours as needed for severe pain. 11/22/13   Pattricia Boss, MD    Family History No family history on file.  Social History Social History  Substance Use Topics  . Smoking status: Current Every Day Smoker    Types: Cigarettes  . Smokeless tobacco: Never Used  . Alcohol use Yes     Comment: Occ     Allergies   Patient has no known allergies.   Review of Systems Review of Systems  Respiratory: Positive for shortness of breath.   ROS: Statement: All systems negative except as marked or noted in the HPI; Constitutional: Negative for fever and chills. ; ; Eyes: Negative for eye pain, redness and discharge. ; ;  ENMT: Negative for ear pain, hoarseness, nasal congestion, sinus pressure and sore throat. ; ; Cardiovascular: Negative for chest pain, palpitations, diaphoresis, and +peripheral edema. ; ; Respiratory: +cough, wheezing, SOB. Negative for stridor. ; ; Gastrointestinal: Negative for nausea, vomiting, diarrhea, abdominal pain, blood in stool, hematemesis, jaundice and rectal bleeding. . ; ; Genitourinary: Negative for dysuria, flank pain and hematuria. ; ; Musculoskeletal: Negative for back pain and neck pain. Negative for swelling and trauma.; ; Skin: Negative for pruritus, rash, abrasions, blisters, bruising and skin lesion.; ; Neuro: Negative for headache, lightheadedness and neck stiffness. Negative for weakness, altered level of consciousness, altered mental status, extremity weakness, paresthesias, involuntary movement, seizure and syncope.      Physical Exam Updated Vital Signs BP (!) 171/113 (BP Location: Left Arm)   Pulse 97   Temp 99 F (37.2 C) (Temporal)   Resp (!) 32   Ht 5\' 5"  (1.651 m)   Wt 166 lb (75.3 kg)   SpO2 92%   BMI 27.62 kg/m     Patient Vitals for the past 24 hrs:  BP Temp Temp src Pulse Resp SpO2 Height Weight  06/17/16 1950 - - - 95 22 94 % - -  06/17/16 1941 - - - 95 16 90 % - -  06/17/16 1930 162/93 - - 97 19 (!) 87 % - -  06/17/16 1916 (!) 154/102 - -  103 23 92 % - -  06/17/16 1830 (!) 164/112 - - 89 (!) 28 100 % - -  06/17/16 1800 (!) 160/107 - - 71 13 100 % - -  06/17/16 1744 - - - - - 94 % - -  06/17/16 1426 (!) 171/113 99 F (37.2 C) Temporal 97 (!) 32 92 % 5\' 5"  (1.651 m) 166 lb (75.3 kg)     Physical Exam 1710: Physical examination:  Nursing notes reviewed; Vital signs and O2 SAT reviewed;  Constitutional: Well developed, Well nourished, Well hydrated, Uncomfortable appearing.;; Head:  Normocephalic, atraumatic; Eyes: EOMI, PERRL, No scleral icterus; ENMT: Mouth and pharynx normal, Mucous membranes moist; Neck: Supple, Full range of motion, No  lymphadenopathy; Cardiovascular: Tachycardic rate and rhythm, No gallop; Respiratory: Breath sounds diminished & equal bilaterally, insp/exp wheezes bilat. Faint audible wheezing.  Speaking short sentences, sitting upright, tachypneic.; Chest: Nontender, Movement normal; Abdomen: Soft, Nontender, Nondistended, Normal bowel sounds; Genitourinary: No CVA tenderness; Extremities: Pulses normal, No tenderness, +2 pedal edema bilat. No calf asymmetry.; Neuro: AA&Ox3, Major CN grossly intact.  Speech clear. No gross focal motor or sensory deficits in extremities.; Skin: Color normal, Warm, Dry.   ED Treatments / Results  Labs (all labs ordered are listed, but only abnormal results are displayed)   EKG  EKG Interpretation  Date/Time:  Monday June 17 2016 14:30:25 EST Ventricular Rate:  91 PR Interval:  122 QRS Duration: 80 QT Interval:  372 QTC Calculation: 457 R Axis:   61 Text Interpretation:  Normal sinus rhythm Septal infarct , age undetermined When compared with ECG of 08/31/2013 No significant change was found Confirmed by [facility]  MD, Nunzio Cory [phone]) on 06/17/2016 5:28:17 PM       Radiology   Procedures Procedures (including critical care time)  Medications Ordered in ED Medications - No data to display   Initial Impression / Assessment and Plan / ED Course  I have reviewed the triage vital signs and the nursing notes.  Pertinent labs & imaging results that were available during my care of the patient were reviewed by me and considered in my medical decision making (see chart for details).  MDM Reviewed: previous chart, nursing note and vitals Reviewed previous: labs and ECG Interpretation: labs, ECG and x-ray Total time providing critical care: 30-74 minutes. This excludes time spent performing separately reportable procedures and services. Consults: admitting MD   CRITICAL CARE Performed by: Alfonzo Feller Total critical care time: 35 minutes Critical care time  was exclusive of separately billable procedures and treating other patients. Critical care was necessary to treat or prevent imminent or life-threatening deterioration. Critical care was time spent personally by me on the following activities: development of treatment plan with patient and/or surrogate as well as nursing, discussions with consultants, evaluation of patient's response to treatment, examination of patient, obtaining history from patient or surrogate, ordering and performing treatments and interventions, ordering and review of laboratory studies, ordering and review of radiographic studies, pulse oximetry and re-evaluation of patient's condition.   Results for orders placed or performed during the hospital encounter of [id number]  Basic metabolic panel  Result Value Ref Range   Sodium 141 135 - 145 mmol/L   Potassium 4.2 3.5 - 5.1 mmol/L   Chloride 102 101 - 111 mmol/L   CO2 34 (H) 22 - 32 mmol/L   Glucose, Bld 116 (H) 65 - 99 mg/dL   BUN 17 6 - 20 mg/dL   Creatinine, Ser 1.03 0.61 - 1.24 mg/dL  Calcium 9.1 8.9 - 10.3 mg/dL   GFR calc non Af Amer >60 >60 mL/min   GFR calc Af Amer >60 >60 mL/min   Anion gap 5 5 - 15  CBC  Result Value Ref Range   WBC 4.5 4.0 - 10.5 K/uL   RBC 5.29 4.22 - 5.81 MIL/uL   Hemoglobin 14.7 13.0 - 17.0 g/dL   HCT 43.3 39.0 - 52.0 %   MCV 81.9 78.0 - 100.0 fL   MCH 27.8 26.0 - 34.0 pg   MCHC 33.9 30.0 - 36.0 g/dL   RDW 15.4 11.5 - 15.5 %   Platelets 106 (L) 150 - 400 K/uL  Troponin I  Result Value Ref Range   Troponin I <0.03 <0.03 ng/mL  Brain natriuretic peptide  Result Value Ref Range   B Natriuretic Peptide 30.0 0.0 - 100.0 pg/mL    Dg Chest 2 View Result Date: 06/17/2016 CLINICAL DATA:  Gradually worsening shortness of breath. EXAM: CHEST  2 VIEW COMPARISON:  08/31/2013 FINDINGS: The lungs are clear wiithout focal pneumonia, edema, pneumothorax or pleural effusion. Interstitial markings are diffusely coarsened with chronic features.  Hyperexpansion is consistent with emphysema. The cardiopericardial silhouette is within normal limits for size. The visualized bony structures of the thorax are intact. IMPRESSION: Emphysema without acute findings. Electronically Signed   By: Misty Stanley M.D.   On: 06/17/2016 14:45    1950:  On arrival: pt sitting upright, tachypneic, tachycardic, Sats 83 % R/A, lungs diminished. IV solumedrol and hour long neb started. After neb: pt appears more comfortable at rest, less tachypneic, Sats 92 % R/A, lungs continue diminished. Pt ambulated in hallway: pt's O2 Sats dropped to 88 % R/A with pt c/o increasing SOB, with increasing HR and RR, lungs again with insp/exp wheezing. Pt escorted back to stretcher with O2 Sats slowly increasing to 90-94 % on O2 2L N/C. Dx and testing d/w pt.  Questions answered.  Verb understanding, agreeable to admit. T/C to [facility]. Shanon Brow, case discussed, including:  HPI, pertinent PM/SHx, VS/PE, dx testing, ED course and treatment:  Agreeable to admit, requests to write temporary orders, obtain inpt tele bed to team APAdmits.     Final Clinical Impressions(s) / ED Diagnoses   Final diagnoses:  None    New Prescriptions New Prescriptions   No medications on file      Francine Graven, DO 06/19/16 1845

## 2016-06-17 NOTE — ED Notes (Signed)
Continuos neb completed

## 2016-06-17 NOTE — H&P (Signed)
History and Physical    Marvin Davis [id number] DOB: Oct 12, 1955 DOA: 06/17/2016  PCP: [facility]  Patient coming from:  home  Chief Complaint:   Sob, wheezing  HPI: Marvin Davis is a 61 y.o. male with no medical history mainly due to lack of access to medical care due to lack of insurance comes in from the free clinic today for acute hypoxic respiratory failure.  Pt had his first appt with the free clinic today, has not seen a pcp in years.  He went because he has been having sob and wheezing for months.  He was told a couple of years ago in the ED that he had emphysema.  He has no h/o chf.  He has had worsening sob especially with any activity for over a month.  Denies any fevers.  Has coughing only in the morning when he lights up his first cigarette for the day.  He denies any orthopnea or PND.  He has ble edema which has been present for over a week.  Pt has no home meds to take.  At clinic pt oxygen sats were 83% on RA, so he was sent to the ED.  He was hypertensive with sbp over 180.  Pt has been given several nebs, solumedrol and he is feeling much better, his wheezing is much improved.  Pt was ambulated in the ED and his oxygen sats dropped to the upper 80s on RA.  Pt was therefore referred for admission for likely copde with hypoxia with possible component of underlying chf.  Pt denies ever experiencing any chest pain even exertional.  Review of Systems: As per HPI otherwise 10 point review of systems negative.   History reviewed. No pertinent past medical history.  History reviewed. No pertinent surgical history.   reports that he has been smoking Cigarettes.  He has never used smokeless tobacco. He reports that he drinks alcohol. He reports that he uses drugs, including Marijuana.  No Known Allergies  No family history on file. no premature CAD  Prior to Admission medications   Medication Sig Start Date End Date Taking? Authorizing Provider    naproxen sodium (ALEVE) 220 MG tablet Take 440 mg by mouth daily as needed. pain   Yes Historical Provider, MD    Physical Exam: Vitals:   06/17/16 1916 06/17/16 1930 06/17/16 1941 06/17/16 1950  BP: (!) 154/102 162/93    Pulse: 103 97 95 95  Resp: 23 19 16 22   Temp:      TempSrc:      SpO2: 92% (!) 87% 90% 94%  Weight:      Height:        Constitutional: NAD, calm, comfortable Vitals:   06/17/16 1916 06/17/16 1930 06/17/16 1941 06/17/16 1950  BP: (!) 154/102 162/93    Pulse: 103 97 95 95  Resp: 23 19 16 22   Temp:      TempSrc:      SpO2: 92% (!) 87% 90% 94%  Weight:      Height:       Eyes: PERRL, lids and conjunctivae normal ENMT: Mucous membranes are moist. Posterior pharynx clear of any exudate or lesions.Normal dentition.  Neck: normal, supple, no masses, no thyromegaly Respiratory: clear to auscultation bilaterally, no wheezing, no crackles. Normal respiratory effort. No accessory muscle use.  Cardiovascular: Regular rate and rhythm, no murmurs / rubs / gallops. 1+ ble pitting edema. 2+ pedal pulses. No carotid bruits.  Abdomen: no tenderness, no  masses palpated. No hepatosplenomegaly. Bowel sounds positive.  Musculoskeletal: no clubbing / cyanosis. No joint deformity upper and lower extremities. Good ROM, no contractures. Normal muscle tone.  Skin: no rashes, lesions, ulcers. No induration Neurologic: CN 2-12 grossly intact. Sensation intact, DTR normal. Strength 5/5 in all 4.  Psychiatric: Normal judgment and insight. Alert and oriented x 3. Normal mood.    Labs on Admission: I have personally reviewed following labs and imaging studies  CBC:  Recent Labs Lab 06/17/16 1458  WBC 4.5  HGB 14.7  HCT 43.3  MCV 81.9  PLT [id number]*   Basic Metabolic Panel:  Recent Labs Lab 06/17/16 1458  NA 141  K 4.2  CL 102  CO2 34*  GLUCOSE 116*  BUN 17  CREATININE 1.03  CALCIUM 9.1   GFR: Estimated Creatinine Clearance: 71.4 mL/min (by C-G formula based on SCr of  1.03 mg/dL).  Cardiac Enzymes:  Recent Labs Lab 06/17/16 1458  TROPONINI <0.03     Radiological Exams on Admission: Dg Chest 2 View  Result Date: 06/17/2016 CLINICAL DATA:  Gradually worsening shortness of breath. EXAM: CHEST  2 VIEW COMPARISON:  08/31/2013 FINDINGS: The lungs are clear wiithout focal pneumonia, edema, pneumothorax or pleural effusion. Interstitial markings are diffusely coarsened with chronic features. Hyperexpansion is consistent with emphysema. The cardiopericardial silhouette is within normal limits for size. The visualized bony structures of the thorax are intact. IMPRESSION: Emphysema without acute findings. Electronically Signed   By: Misty Stanley M.D.   On: 06/17/2016 14:45    EKG: Independently reviewed. nsr no acute issues cxr reviewed no edema or infiltrate, hyperinflated  Assessment/Plan 61 yo male with likely underlying COPD comes in with sob and wheezing for months associated with ble edema for the last week  Principal Problem:   Acute respiratory failure with hypoxia ([place])- appears mostly due to pulmonary etiology but unsure if there is a component of underlying diastolic dysfunction.  Treat for copde , see plan below.  Wean oxgyen as clinically tolerates.  o2 sats normal on 2 liters, pt in no resp distress at this time.  Active Problems:   COPD suggested by initial evaluation ([place])- copde - place on freq nebs, iv solumedrol.  zpack orally.     Edema of both legs- bnp normal, give lasix 20mg  iv q 24 hours, will check cardiac echo in the am, would not be surprised if has diastolic dysfunction of some sort   Dyspnea- as above   Hypertension- untreated, start on maxzide.  Consider adding bblocker once resp status improves.  Also giving lasix 20mg  iv daily for now    Tobacco abuse - encourage to quit, he knows this but it is difficult    DVT prophylaxis: scds, ambulate  Code Status:  full Family Communication: none  Disposition Plan:  Per day  team Consults called:  none Admission status:  admission   Tramaine Sauls A MD [facility]  If 7PM-7AM, please contact night-coverage [email or web address] Password [facility]  06/17/2016, 8:13 PM

## 2016-06-17 NOTE — ED Notes (Signed)
O2 while ambulating went from 88 to 98.

## 2016-06-17 NOTE — Congregational Nurse Program (Signed)
Congregational Nurse Program Note  Date of Encounter: 06/17/2016  Past Medical History: No past medical history on file.  Encounter Details:     CNP Questionnaire - 06/17/16 1439      Patient Demographics   Race African-American/Black     Note: Clarification . [facility]: [place] is not a primary care provider. We were screening this client to assist him to navigate into a primary care provider that would see him without income or health insurance. Upon screening it was noted that client needed urgent care and he was referred to the Emergency room at [facility] for care. Client is aware that once he has been evaluated he will contact RN at [facility] for assistance with a referral into a primary Care provider. Client given RN contact information.

## 2016-06-17 NOTE — Congregational Nurse Program (Signed)
Congregational Nurse Program Note  Date of Encounter: 06/17/2016  Past Medical History: No past medical history on file.  Encounter Details:     CNP Questionnaire - 06/17/16 1421      Patient Demographics   Is this a new or existing patient? New   Patient is considered a/an Not Applicable     Patient Assistance   Location of Patient [place]   Patient's financial/insurance status Low Income;Self-Pay (Uninsured)   Uninsured Patient ([place] Card/Care Connects) Yes   Interventions Referred to ED/Urgent Care   Patient referred to apply for the following financial assistance Not Applicable   Food insecurities addressed Not Applicable   Transportation assistance No   Assistance securing medications No   Educational health offerings Acute disease;Hypertension;Navigating the healthcare system     Encounter Details   Primary purpose of visit Acute Illness/Condition Visit;Education/Health Concerns;Navigating the Healthcare System   Was an Emergency Department visit averted? No   Does patient have a medical provider? No   Patient referred to Establish PCP;Emergency Department   Was a mental health screening completed? (GAINS tool) No   Does patient have dental issues? No   Does patient have vision issues? Yes   Was a vision referral made? No   Does your patient have an abnormal blood pressure today? Yes   Since previous encounter, have you referred patient for abnormal blood pressure that resulted in a new diagnosis or medication change? No   Does your patient have an abnormal blood glucose today? No   Since previous encounter, have you referred patient for abnormal blood glucose that resulted in a new diagnosis or medication change? No   Was there a life-saving intervention made? No     Client called to be seen for a screening at [facility] today. He states "he thinks he has gout in his feet" No insurance and currently no income. Client [place]  [place] and has not been seen in years by an MD. Client last seen in [facility] in 2015. Today upon arrival client is extremely short of breath , respirations 22 and O2 saturation on room air of 83%. Client denies chest pain , but states his legs have been swollen and painful for approximately 2 months and he states that since then he has had increased shortness of breath. Blood pressure today 186/116 pulse 101 left arm, right arm blood pressure 175/107.  Bilateral lower extremities with +2 edema including ankles and both feet. Client states they feel tight and painful to touch like "pins and needles"he states he has increased shortness of breath with walking and exertion. Alert and oriented to person and place. Lungs sounds diminished with expiratory wheezes. Client easily exerted. Will conclude screening today and recommend client to seek emergency treatment at [facility] for his blood pressure and evaluation of his edema and shortness of breath. Client to call RN later, contact information given and we will help client navigate into a primary care provider such as [facility] once he has been evaluated at the hospital. Client willing to be seen stating "I know I've been sick , but have put it off" Client refuses transportation by ambulance, mother is here and will drive him immediately.  RN will follow up with client with his permission. Summary of visit today given to client , RN  walked  Patient to his mother's car and is in route to [facility] per mother.

## 2016-06-17 NOTE — ED Triage Notes (Signed)
Pt sent from pcp for sob and ble edema.

## 2016-06-18 ENCOUNTER — Inpatient Hospital Stay (HOSPITAL_COMMUNITY): Payer: Self-pay

## 2016-06-18 ENCOUNTER — Encounter (HOSPITAL_COMMUNITY): Payer: Self-pay | Admitting: *Deleted

## 2016-06-18 DIAGNOSIS — R0600 Dyspnea, unspecified: Secondary | ICD-10-CM

## 2016-06-18 LAB — CBC
HEMATOCRIT: 45.3 % (ref 39.0–52.0)
Hemoglobin: 15.3 g/dL (ref 13.0–17.0)
MCH: 27.5 pg (ref 26.0–34.0)
MCHC: 33.8 g/dL (ref 30.0–36.0)
MCV: 81.5 fL (ref 78.0–100.0)
PLATELETS: 110 10*3/uL — AB (ref 150–400)
RBC: 5.56 MIL/uL (ref 4.22–5.81)
RDW: 15.4 % (ref 11.5–15.5)
WBC: 5.2 10*3/uL (ref 4.0–10.5)

## 2016-06-18 LAB — ECHOCARDIOGRAM COMPLETE
Height: 65 in
Weight: 2598.4 oz

## 2016-06-18 LAB — BASIC METABOLIC PANEL
Anion gap: 7 (ref 5–15)
BUN: 19 mg/dL (ref 6–20)
CHLORIDE: 99 mmol/L — AB (ref 101–111)
CO2: 32 mmol/L (ref 22–32)
CREATININE: 0.9 mg/dL (ref 0.61–1.24)
Calcium: 9 mg/dL (ref 8.9–10.3)
GFR calc non Af Amer: >60 mL/min (ref >60)
Glucose, Bld: 157 mg/dL — ABNORMAL HIGH (ref 65–99)
POTASSIUM: 4.8 mmol/L (ref 3.5–5.1)
Sodium: 138 mmol/L (ref 135–145)

## 2016-06-18 MED ORDER — ENOXAPARIN SODIUM 40 MG/0.4ML ~~LOC~~ SOLN: 40.0000 mg | SUBCUTANEOUS | Status: DC

## 2016-06-18 MED ORDER — IPRATROPIUM-ALBUTEROL 0.5-2.5 (3) MG/3ML IN SOLN
3.0000 mL | Freq: Three times a day (TID) | RESPIRATORY_TRACT | Status: DC
  Administered 2016-06-19: 3 mL via RESPIRATORY_TRACT
  Filled 2016-06-18: qty 3

## 2016-06-18 NOTE — Congregational Nurse Program (Signed)
Congregational Nurse Program Note  Date of Encounter: 06/17/2016  Past Medical History: Past Medical History:  Diagnosis Date  . COPD (chronic obstructive pulmonary disease) [facility])     Encounter Details:     CNP Questionnaire - 06/17/16 1439      Patient Demographics   Race African-American/Black      New client seen today for screening and assistance at [facility] for navigation into a primary care provider due to lack of health insurance and currently without income. [place]: [place] referred client to the emergency room at [facility] due to oxygen saturations 83% , elevated blood pressures and edema of BLE. See previous note.  Will follow up after hospital evaluation to assist client with an appointment into a primary Care provider.(He wishes to be referred into the [facility]). Client has RN at [facility]

## 2016-06-18 NOTE — Progress Notes (Addendum)
PROGRESS NOTE    Marvin Davis  [id number] DOB: June 17, 1955 DOA: 06/17/2016 PCP: [place]   Brief Narrative: 61 year old male with no known past medical history mainly due to lack of access to medical care due to lack of insurance comes in from the free clinic today for acute hypoxic respiratory failure. Patient with worsening shortness of breath and wheezing for about a month. He was found to have oxygen saturation of 83% in room air and elevated blood pressure. Admitted for further evaluation. Patient is active smoker. Assessment & Plan:  # Acute hypoxic respiratory failure likely due to COPD exacerbation: -Patient is chronic smoker. Chest x-ray shows emphysema without any other acute finding. Patient has wheezing on physical exam. -Continue Solu-Medrol, DuoNeb, azithromycin. -Recent reported feeling much better but is still has diffuse wheezing. May be able to switch to oral prednisone by tomorrow. -Wean oxygen gradually. Currently on 2 L of oxygen.  #Possible chronic diastolic heart failure with lower extremity edema: -Patient received IV Lasix with improvement in lower extremity edema. Echocardiogram reviewed with EF of 55-60%. No wall motion abnormalities. Has grade 1 diastolic dysfunction. -Low salt diet  #Hypertension: Started on Maxzide on admission. We will continue for now. Monitor blood pressure closely.  #Actively smoker/tobacco dependence: Education provided to the patient. He said he is trying to quit.  #Thrombocytopenia. No sign of bleeding. Repeat CBC in the morning.  Case manager referral for PCP follow-up.  Principal Problem:   Acute respiratory failure with hypoxia (HCC) Active Problems:   COPD suggested by initial evaluation ([place])   Edema of both legs   Dyspnea   Hypertension   Bronchospasm, acute   DVT prophylaxis: SCD. No anticoagulant because of thrombocytopenia Code Status: Full code Family Communication: No family present  at bedside Disposition Plan: Likely discharge home in 1-2 days.  Consultants:   None  Procedures: None Antimicrobials: Azithromycin  Subjective: Patient was seen and examined at bedside. Patient reported significantly better today but he still had chest tightness, shortness of breath and dry cough. Reported that lower extremity edema is gone. He denied fever, chills, nausea vomiting or abdominal pain.  Objective: Vitals:   06/18/16 0157 06/18/16 0640 06/18/16 0819 06/18/16 1317  BP:  (!) 144/96  (!) 145/89  Pulse:  86  99  Resp:  20  20  Temp:  97.7 F (36.5 C)  97.6 F (36.4 C)  TempSrc:  Oral  Oral  SpO2: 95% 94% 93% 94%  Weight:      Height:        Intake/Output Summary (Last 24 hours) at 06/18/16 1407 Last data filed at 06/18/16 1318  Gross per 24 hour  Intake              480 ml  Output             1950 ml  Net            -1470 ml   Filed Weights   06/17/16 1426 06/17/16 2243  Weight: 75.3 kg (166 lb) 73.7 kg (162 lb 6.4 oz)    Examination:  General exam: Appears calm and comfortable  Respiratory system:Diffuse bilateral expiratory wheeze, no crackle, respiratory effort normal. Cardiovascular system: S1 & S2 heard, RRR.  No pedal edema. Gastrointestinal system: Abdomen is nondistended, soft and nontender. Normal bowel sounds heard. Central nervous system: Alert and oriented. No focal neurological deficits. Extremities: Symmetric 5 x 5 power. Skin: No rashes, lesions or ulcers Psychiatry: Judgement and insight  appear normal. Mood & affect appropriate.     Data Reviewed: I have personally reviewed following labs and imaging studies  CBC:  Recent Labs Lab 06/17/16 1458 06/18/16 0413  WBC 4.5 5.2  HGB 14.7 15.3  HCT 43.3 45.3  MCV 81.9 81.5  PLT 106* [id number]*   Basic Metabolic Panel:  Recent Labs Lab 06/17/16 1458 06/18/16 0413  NA 141 138  K 4.2 4.8  CL 102 99*  CO2 34* 32  GLUCOSE 116* 157*  BUN 17 19  CREATININE 1.03 0.90  CALCIUM 9.1 9.0    GFR: Estimated Creatinine Clearance: 75 mL/min (by C-G formula based on SCr of 0.9 mg/dL). Liver Function Tests: No results for input(s): AST, ALT, ALKPHOS, BILITOT, PROT, ALBUMIN in the last 168 hours. No results for input(s): LIPASE, AMYLASE in the last 168 hours. No results for input(s): AMMONIA in the last 168 hours. Coagulation Profile: No results for input(s): INR, PROTIME in the last 168 hours. Cardiac Enzymes:  Recent Labs Lab 06/17/16 1458  TROPONINI <0.03   BNP (last 3 results) No results for input(s): PROBNP in the last 8760 hours. HbA1C: No results for input(s): HGBA1C in the last 72 hours. CBG: No results for input(s): GLUCAP in the last 168 hours. Lipid Profile: No results for input(s): CHOL, HDL, LDLCALC, TRIG, CHOLHDL, LDLDIRECT in the last 72 hours. Thyroid Function Tests:  Recent Labs  06/17/16 2206  TSH 1.279   Anemia Panel: No results for input(s): VITAMINB12, FOLATE, FERRITIN, TIBC, IRON, RETICCTPCT in the last 72 hours. Sepsis Labs: No results for input(s): PROCALCITON, LATICACIDVEN in the last 168 hours.  No results found for this or any previous visit (from the past 240 hour(s)).       Radiology Studies: Dg Chest 2 View  Result Date: 06/17/2016 CLINICAL DATA:  Gradually worsening shortness of breath. EXAM: CHEST  2 VIEW COMPARISON:  08/31/2013 FINDINGS: The lungs are clear wiithout focal pneumonia, edema, pneumothorax or pleural effusion. Interstitial markings are diffusely coarsened with chronic features. Hyperexpansion is consistent with emphysema. The cardiopericardial silhouette is within normal limits for size. The visualized bony structures of the thorax are intact. IMPRESSION: Emphysema without acute findings. Electronically Signed   By: Misty Stanley M.D.   On: 06/17/2016 14:45        Scheduled Meds: . aspirin EC  81 mg Oral Daily  . [START ON 06/19/2016] azithromycin  250 mg Oral Daily  . furosemide  20 mg Intravenous Daily  .  ipratropium-albuterol  3 mL Nebulization Q6H  . methylPREDNISolone (SOLU-MEDROL) injection  80 mg Intravenous Q12H  . sodium chloride flush  3 mL Intravenous Q12H  . triamterene-hydrochlorothiazide  1 tablet Oral Daily   Continuous Infusions:   LOS: 1 day    Bathsheba Durrett Tanna Furry, MD [facility] Pager [phone]  If 7PM-7AM, please contact night-coverage [email or web address] Password [email or web address] 06/18/2016, 2:07 PM

## 2016-06-18 NOTE — Care Management Note (Signed)
Case Management Note  Patient Details  Name: OBINNA FRANCKE MRN: [id number] Date of Birth: 02-Sep-1955  Subjective/Objective:   Patient adm from home with acute respiratory with hypoxia. No oxygen or HH PTA. Lives with mother, walks with cane, ind with ADL's. He has been going to [facility] for care. CM gave provider list. Patient does not have insurance. Will consult financial counselor for financial assistance.      Action/Plan: CM will follow for oxygen needs and medication assistance if needed.    Expected Discharge Date:  06/20/16               Expected Discharge Plan:  Home/Self Care  In-House Referral:  NA  Discharge planning Services  CM Consult  Post Acute Care Choice:  NA Choice offered to:  NA  DME Arranged:    DME Agency:     HH Arranged:    HH Agency:     Status of Service:  In process, will continue to follow  If discussed at Long Length of Stay Meetings, dates discussed:    Additional Comments:  Merranda Bolls, Chauncey Reading, RN 06/18/2016, 2:10 PM

## 2016-06-18 NOTE — Progress Notes (Signed)
*  PRELIMINARY RESULTS* Echocardiogram 2D Echocardiogram has been performed.  Marvin Davis 06/18/2016, 10:41 AM

## 2016-06-19 DIAGNOSIS — J9601 Acute respiratory failure with hypoxia: Secondary | ICD-10-CM

## 2016-06-19 LAB — CBC
HCT: 45.7 % (ref 39.0–52.0)
Hemoglobin: 15.6 g/dL (ref 13.0–17.0)
MCH: 27.7 pg (ref 26.0–34.0)
MCHC: 34.1 g/dL (ref 30.0–36.0)
MCV: 81.2 fL (ref 78.0–100.0)
PLATELETS: 127 10*3/uL — AB (ref 150–400)
RBC: 5.63 MIL/uL (ref 4.22–5.81)
RDW: 15.6 % — AB (ref 11.5–15.5)
WBC: 10.3 10*3/uL (ref 4.0–10.5)

## 2016-06-19 MED ORDER — IPRATROPIUM-ALBUTEROL 0.5-2.5 (3) MG/3ML IN SOLN
3.0000 mL | Freq: Four times a day (QID) | RESPIRATORY_TRACT | Status: DC
  Administered 2016-06-19 – 2016-06-20 (×5): 3 mL via RESPIRATORY_TRACT
  Filled 2016-06-19 (×5): qty 3

## 2016-06-19 MED ORDER — ORAL CARE MOUTH RINSE
15.0000 mL | Freq: Two times a day (BID) | OROMUCOSAL | Status: DC
  Administered 2016-06-19 – 2016-06-21 (×3): 15 mL via OROMUCOSAL

## 2016-06-19 MED ORDER — IPRATROPIUM-ALBUTEROL 0.5-2.5 (3) MG/3ML IN SOLN: 3.0000 mL | Freq: Two times a day (BID) | RESPIRATORY_TRACT | Status: DC

## 2016-06-19 NOTE — Progress Notes (Signed)
PROGRESS NOTE    Marvin Davis  [id number] DOB: 1955/06/29 DOA: 06/17/2016 PCP: [place]   Brief Narrative: 61 year old male with no known past medical history mainly due to lack of access to medical care due to lack of insurance comes in from the free clinic today for acute hypoxic respiratory failure. Patient with worsening shortness of breath and wheezing for about a month. He was found to have oxygen saturation of 83% in room air and elevated blood pressure. Admitted for further evaluation. Patient is active smoker. Assessment & Plan:  # Acute hypoxic respiratory failure likely due to COPD exacerbation: -Patient is chronic smoker. Chest x-ray shows emphysema without any other acute finding. Patient has wheezing on physical exam. -Continue Solu-Medrol, DuoNeb, azithromycin. -Recent reported feeling much better but is still has diffuse wheezing. May be able to switch to oral prednisone by tomorrow. -Wean oxygen gradually. Currently on 2 L of oxygen.  #Possible chronic diastolic heart failure with lower extremity edema: -Patient received IV Lasix with improvement in lower extremity edema. Echocardiogram reviewed with EF of 55-60%. No wall motion abnormalities. Has grade 1 diastolic dysfunction. -Low salt diet  #Hypertension: Started on Maxzide on admission. We will continue for now. Monitor blood pressure closely.  #Actively smoker/tobacco dependence: Education provided to the patient. He said he is trying to quit.  #Thrombocytopenia. No sign of bleeding. Repeat CBC in the morning.  Case manager referral for PCP follow-up.  Principal Problem:   Acute respiratory failure with hypoxia (HCC) Active Problems:   COPD suggested by initial evaluation ([place])   Edema of both legs   Dyspnea   Hypertension   Bronchospasm, acute   DVT prophylaxis: SCD. No anticoagulant because of thrombocytopenia Code Status: Full code Family Communication: No family present  at bedside Disposition Plan: Likely discharge home in 1-2 days.  Consultants:   None  Procedures: None Antimicrobials: Azithromycin  Subjective: Patient was seen and examined at bedside. Patient reported significantly better today but he still had chest tightness, shortness of breath and dry cough. Reported that lower extremity edema is gone. He denied fever, chills, nausea vomiting or abdominal pain.  Objective: Vitals:   06/18/16 1935 06/18/16 2245 06/19/16 0532 06/19/16 0810  BP:  (!) 148/95 (!) 150/96   Pulse:  79 84   Resp:  20 20   Temp:  97.8 F (36.6 C) 97.7 F (36.5 C)   TempSrc:  Oral Oral   SpO2: 94% 91% 96% 96%  Weight:      Height:        Intake/Output Summary (Last 24 hours) at 06/19/16 1336 Last data filed at 06/19/16 0900  Gross per 24 hour  Intake              240 ml  Output             1500 ml  Net            -1260 ml   Filed Weights   06/17/16 1426 06/17/16 2243  Weight: 75.3 kg (166 lb) 73.7 kg (162 lb 6.4 oz)    Examination:  General exam: Appears calm and comfortable  Respiratory system:Diffuse bilateral expiratory wheeze, no crackle, respiratory effort normal. Cardiovascular system: S1 & S2 heard, RRR.  No pedal edema. Gastrointestinal system: Abdomen is nondistended, soft and nontender. Normal bowel sounds heard. Central nervous system: Alert and oriented. No focal neurological deficits. Extremities: Symmetric 5 x 5 power. Skin: No rashes, lesions or ulcers Psychiatry: Judgement and insight  appear normal. Mood & affect appropriate.     Data Reviewed: I have personally reviewed following labs and imaging studies  CBC:  Recent Labs Lab 06/17/16 1458 06/18/16 0413 06/19/16 0409  WBC 4.5 5.2 10.3  HGB 14.7 15.3 15.6  HCT 43.3 45.3 45.7  MCV 81.9 81.5 81.2  PLT 106* 110* [id number]*   Basic Metabolic Panel:  Recent Labs Lab 06/17/16 1458 06/18/16 0413  NA 141 138  K 4.2 4.8  CL 102 99*  CO2 34* 32  GLUCOSE 116* 157*  BUN 17 19    CREATININE 1.03 0.90  CALCIUM 9.1 9.0   GFR: Estimated Creatinine Clearance: 75 mL/min (by C-G formula based on SCr of 0.9 mg/dL). Liver Function Tests: No results for input(s): AST, ALT, ALKPHOS, BILITOT, PROT, ALBUMIN in the last 168 hours. No results for input(s): LIPASE, AMYLASE in the last 168 hours. No results for input(s): AMMONIA in the last 168 hours. Coagulation Profile: No results for input(s): INR, PROTIME in the last 168 hours. Cardiac Enzymes:  Recent Labs Lab 06/17/16 1458  TROPONINI <0.03   BNP (last 3 results) No results for input(s): PROBNP in the last 8760 hours. HbA1C: No results for input(s): HGBA1C in the last 72 hours. CBG: No results for input(s): GLUCAP in the last 168 hours. Lipid Profile: No results for input(s): CHOL, HDL, LDLCALC, TRIG, CHOLHDL, LDLDIRECT in the last 72 hours. Thyroid Function Tests:  Recent Labs  06/17/16 2206  TSH 1.279   Anemia Panel: No results for input(s): VITAMINB12, FOLATE, FERRITIN, TIBC, IRON, RETICCTPCT in the last 72 hours. Sepsis Labs: No results for input(s): PROCALCITON, LATICACIDVEN in the last 168 hours.  No results found for this or any previous visit (from the past 240 hour(s)).       Radiology Studies: Dg Chest 2 View  Result Date: 06/17/2016 CLINICAL DATA:  Gradually worsening shortness of breath. EXAM: CHEST  2 VIEW COMPARISON:  08/31/2013 FINDINGS: The lungs are clear wiithout focal pneumonia, edema, pneumothorax or pleural effusion. Interstitial markings are diffusely coarsened with chronic features. Hyperexpansion is consistent with emphysema. The cardiopericardial silhouette is within normal limits for size. The visualized bony structures of the thorax are intact. IMPRESSION: Emphysema without acute findings. Electronically Signed   By: Misty Stanley M.D.   On: 06/17/2016 14:45        Scheduled Meds: . aspirin EC  81 mg Oral Daily  . azithromycin  250 mg Oral Daily  .  ipratropium-albuterol  3 mL Nebulization BID  . methylPREDNISolone (SOLU-MEDROL) injection  80 mg Intravenous Q12H  . sodium chloride flush  3 mL Intravenous Q12H  . triamterene-hydrochlorothiazide  1 tablet Oral Daily   Continuous Infusions:   LOS: 2 days    Thurnell Lose, MD [facility] Pager [phone]  If 7PM-7AM, please contact night-coverage [email or web address] Password [email or web address] 06/19/2016, 1:36 PM

## 2016-06-19 NOTE — Progress Notes (Signed)
Patient's oxygen saturation while ambulating on room air dropped to 83%. Patient was put back in chair and oxygen was placed back on him at 2L and his oxygen level increased to 97%.

## 2016-06-20 MED ORDER — IPRATROPIUM-ALBUTEROL 0.5-2.5 (3) MG/3ML IN SOLN
3.0000 mL | Freq: Three times a day (TID) | RESPIRATORY_TRACT | Status: DC
  Administered 2016-06-21: 3 mL via RESPIRATORY_TRACT
  Filled 2016-06-20: qty 3

## 2016-06-20 MED ORDER — FUROSEMIDE 10 MG/ML IJ SOLN
40.0000 mg | Freq: Once | INTRAMUSCULAR | Status: AC
  Administered 2016-06-20: 40 mg via INTRAVENOUS
  Filled 2016-06-20: qty 4

## 2016-06-20 NOTE — Progress Notes (Signed)
PROGRESS NOTE    Marvin Davis  [id number] DOB: 1955/07/26 DOA: 06/17/2016 PCP: [place]   Brief Narrative: 61 year old male with no known past medical history mainly due to lack of access to medical care due to lack of insurance comes in from the free clinic today for acute hypoxic respiratory failure. Patient with worsening shortness of breath and wheezing for about a month. He was found to have oxygen saturation of 83% in room air and elevated blood pressure. Admitted for further evaluation. Patient is active smoker. Assessment & Plan:  # Acute hypoxic respiratory failure likely due to COPD exacerbation: -Patient is chronic smoker. Chest x-ray shows emphysema without any other acute finding. Patient has wheezing on physical exam. -Continue Solu-Medrol, DuoNeb, azithromycin. Continue as still wheezing. -did qualify for Home o2 on 06-20-16.  #Mild Acute on chronic diastolic heart failure EF 60%: -Lasix IV, Low salt diet  #Hypertension: Started on Maxzide on admission. We will continue for now. Monitor blood pressure closely.  #Actively smoker/tobacco dependence: Education provided to the patient. He said he is trying to quit.  #Thrombocytopenia. No sign of bleeding. Repeat CBC in the morning.  Case manager referral for PCP follow-up.    Principal Problem:   Acute respiratory failure with hypoxia (HCC) Active Problems:   COPD suggested by initial evaluation ([place])   Edema of both legs   Dyspnea   Hypertension   Bronchospasm, acute   DVT prophylaxis: SCD. No anticoagulant because of thrombocytopenia Code Status: Full code Family Communication: No family present at bedside Disposition Plan: Likely discharge home in 1-2 days.  Consultants:   None  Procedures: None Antimicrobials: Azithromycin  Subjective: Patient was seen and examined at bedside. Patient reported significantly better today but he still had chest tightness, shortness of  breath and dry cough. Reported that lower extremity edema is gone. He denied fever, chills, nausea vomiting or abdominal pain.  Objective: Vitals:   06/19/16 2026 06/20/16 0626 06/20/16 0840 06/20/16 0845  BP: (!) 155/93 138/90    Pulse: 92 81    Resp: 20 20    Temp: 98.1 F (36.7 C) 97.5 F (36.4 C)    TempSrc: Oral Oral    SpO2: 92% 96% (!) 79% 95%  Weight:      Height:        Intake/Output Summary (Last 24 hours) at 06/20/16 0917 Last data filed at 06/20/16 0618  Gross per 24 hour  Intake              483 ml  Output             2525 ml  Net            -2042 ml   Filed Weights   06/17/16 1426 06/17/16 2243  Weight: 75.3 kg (166 lb) 73.7 kg (162 lb 6.4 oz)    Examination:  General exam: Appears calm and comfortable  Respiratory system:Diffuse bilateral expiratory wheeze, no crackle, respiratory effort normal. Cardiovascular system: S1 & S2 heard, RRR.  No pedal edema. Gastrointestinal system: Abdomen is nondistended, soft and nontender. Normal bowel sounds heard. Central nervous system: Alert and oriented. No focal neurological deficits. Extremities: Symmetric 5 x 5 power. Skin: No rashes, lesions or ulcers Psychiatry: Judgement and insight appear normal. Mood & affect appropriate.     Data Reviewed: I have personally reviewed following labs and imaging studies  CBC:  Recent Labs Lab 06/17/16 1458 06/18/16 0413 06/19/16 0409  WBC 4.5 5.2 10.3  HGB  14.7 15.3 15.6  HCT 43.3 45.3 45.7  MCV 81.9 81.5 81.2  PLT 106* 110* [id number]*   Basic Metabolic Panel:  Recent Labs Lab 06/17/16 1458 06/18/16 0413  NA 141 138  K 4.2 4.8  CL 102 99*  CO2 34* 32  GLUCOSE 116* 157*  BUN 17 19  CREATININE 1.03 0.90  CALCIUM 9.1 9.0   GFR: Estimated Creatinine Clearance: 75 mL/min (by C-G formula based on SCr of 0.9 mg/dL). Liver Function Tests: No results for input(s): AST, ALT, ALKPHOS, BILITOT, PROT, ALBUMIN in the last 168 hours. No results for input(s): LIPASE,  AMYLASE in the last 168 hours. No results for input(s): AMMONIA in the last 168 hours. Coagulation Profile: No results for input(s): INR, PROTIME in the last 168 hours. Cardiac Enzymes:  Recent Labs Lab 06/17/16 1458  TROPONINI <0.03   BNP (last 3 results) No results for input(s): PROBNP in the last 8760 hours. HbA1C: No results for input(s): HGBA1C in the last 72 hours. CBG: No results for input(s): GLUCAP in the last 168 hours. Lipid Profile: No results for input(s): CHOL, HDL, LDLCALC, TRIG, CHOLHDL, LDLDIRECT in the last 72 hours. Thyroid Function Tests:  Recent Labs  06/17/16 2206  TSH 1.279   Anemia Panel: No results for input(s): VITAMINB12, FOLATE, FERRITIN, TIBC, IRON, RETICCTPCT in the last 72 hours. Sepsis Labs: No results for input(s): PROCALCITON, LATICACIDVEN in the last 168 hours.  No results found for this or any previous visit (from the past 240 hour(s)).       Radiology Studies: No results found.      Scheduled Meds: . aspirin EC  81 mg Oral Daily  . azithromycin  250 mg Oral Daily  . furosemide  40 mg Intravenous Once  . ipratropium-albuterol  3 mL Nebulization QID  . mouth rinse  15 mL Mouth Rinse BID  . methylPREDNISolone (SOLU-MEDROL) injection  80 mg Intravenous Q12H  . sodium chloride flush  3 mL Intravenous Q12H  . triamterene-hydrochlorothiazide  1 tablet Oral Daily   Continuous Infusions:   LOS: 3 days    Thurnell Lose, MD [facility] Pager [phone]  If 7PM-7AM, please contact night-coverage [email or web address] Password [facility] 06/20/2016, 9:17 AM

## 2016-06-20 NOTE — Progress Notes (Signed)
SATURATION QUALIFICATIONS: (This note is used to comply with regulatory documentation for home oxygen)  Patient Saturations on Room Air at Rest = 89%  Patient Saturations on Room Air while Ambulating = 79%  Patient Saturations on 2 Liters of oxygen while Ambulating = 92%  Patient Saturations on 2 Liters of oxygen at rest = 96%  Please briefly explain why patient needs home oxygen: Pt desats while on room air and ambulating

## 2016-06-21 ENCOUNTER — Inpatient Hospital Stay (HOSPITAL_COMMUNITY): Payer: Self-pay

## 2016-06-21 MED ORDER — TIOTROPIUM BROMIDE MONOHYDRATE 18 MCG IN CAPS: 18.0000 ug | ORAL_CAPSULE | Freq: Every day | RESPIRATORY_TRACT | 0 refills | Status: DC

## 2016-06-21 MED ORDER — AMLODIPINE BESYLATE 10 MG PO TABS: 10.0000 mg | ORAL_TABLET | Freq: Every day | ORAL | 0 refills | Status: DC

## 2016-06-21 MED ORDER — ALBUTEROL SULFATE (2.5 MG/3ML) 0.083% IN NEBU: 2.5000 mg | INHALATION_SOLUTION | RESPIRATORY_TRACT | 0 refills | Status: DC | PRN

## 2016-06-21 MED ORDER — PREDNISONE 5 MG PO TABS: ORAL_TABLET | ORAL | 0 refills | Status: DC

## 2016-06-21 NOTE — Discharge Summary (Addendum)
Marvin Davis [id number] DOB: 10/26/1955 DOA: 06/17/2016  PCP: [facility] date: 06/17/2016  Discharge date: 06/21/2016  Admitted From: Home   Disposition:  Home   Recommendations for Outpatient Follow-up:   Follow up with PCP in 1-2 weeks  PCP Please obtain BMP/CBC, 2 view CXR in 1week,  (see Discharge instructions)   PCP Please follow up on the following pending results: None   Home Health: None   Equipment/Devices: o2  Consultations: None Discharge Condition: Stable   CODE STATUS: Full   Diet Recommendation:  Heart Healthy    Chief Complaint  Patient presents with  . Shortness of Breath     Brief history of present illness from the day of admission and additional interim summary    61 year old male with no known past medical history mainly due to lack of access to medical care due to lack of insurance comes in from the free clinic today for acute hypoxic respiratory failure. Patient with worsening shortness of breath and wheezing for about a month. He was found to have oxygen saturation of 83% in room air and elevated blood pressure. Admitted for further evaluation. Patient is active smoker.                                                     Hospital issues addressed    # Acute hypoxic respiratory failure due to COPD exacerbation:  He is an active smoker with no previous diagnosis of COPD, treated with IV sodium Medrol, empiric azithromycin along with mobilizer treatments and oxygen, he was slow to recover but now feels much better, he is moving moderate amounts of air bilaterally and still has mild wheezing. Will be placed on a steroid taper, counseled to quit smoking, we'll get Spiriva along with albuterol nebulizer treatments. Qualified for Home 02. Case management  requested to assist the patient with home medications and finding a PCP. Also recommend one-time outpatient pulmonary follow-up.   #Mild Acute on chronic diastolic heart failure EF 60%: Resolved after a dose of Lasix, had minimal fluid overload, requested to stay on low-salt diet, fluid restriction instructions provided. PCP please monitor fluid status. May require low-dose diuretic if stays in fluid overload in the future.   #Hypertension: Based on Norvasc.  #Actively smoker/tobacco dependence: Counseled to quit.  # Mild Thrombocytopenia. No sign of bleeding. PCP to check CBC in 1-2 weeks outpatient.     Discharge diagnosis     Principal Problem:   Acute respiratory failure with hypoxia (HCC) Active Problems:   COPD suggested by initial evaluation ([place])   Edema of both legs   Dyspnea   Hypertension   Bronchospasm, acute    Discharge instructions    Discharge Instructions    DME Nebulizer/meds    Complete by:  As directed    Patient needs a nebulizer to treat  with the following condition:  COPD (chronic obstructive pulmonary disease) (HCC)   Diet - low sodium heart healthy    Complete by:  As directed    Discharge instructions    Complete by:  As directed    Follow with Primary MD [place] in 7 days   Get CBC, CMP, 2 view Chest X ray checked  by Primary MD or SNF MD in 5-7 days ( we routinely change or add medications that can affect your baseline labs and fluid status, therefore we recommend that you get the mentioned basic workup next visit with your PCP, your PCP may decide not to get them or add new tests based on their clinical decision)   Activity: As tolerated with Full fall precautions use walker/cane & assistance as needed   Disposition Home     Diet:   Heart Healthy    For Heart failure patients - Check your Weight same time everyday, if you gain over 2 pounds, or you develop in leg swelling, experience more shortness of breath or  chest pain, call your Primary MD immediately. Follow Cardiac Low Salt Diet and 1.5 lit/day fluid restriction.   On your next visit with your primary care physician please Get Medicines reviewed and adjusted.   Please request your Prim.MD to go over all Hospital Tests and Procedure/Radiological results at the follow up, please get all Hospital records sent to your Prim MD by signing hospital release before you go home.   If you experience worsening of your admission symptoms, develop shortness of breath, life threatening emergency, suicidal or homicidal thoughts you must seek medical attention immediately by calling 911 or calling your MD immediately  if symptoms less severe.  You Must read complete instructions/literature along with all the possible adverse reactions/side effects for all the Medicines you take and that have been prescribed to you. Take any new Medicines after you have completely understood and accpet all the possible adverse reactions/side effects.   Do not drive, operate heavy machinery, perform activities at heights, swimming or participation in water activities or provide baby sitting services if your were admitted for syncope or siezures until you have seen by Primary MD or a Neurologist and advised to do so again.  Do not drive when taking Pain medications.    Do not take more than prescribed Pain, Sleep and Anxiety Medications  Special Instructions: If you have smoked or chewed Tobacco  in the last 2 yrs please stop smoking, stop any regular Alcohol  and or any Recreational drug use.  Wear Seat belts while driving.   Please note  You were cared for by a hospitalist during your hospital stay. If you have any questions about your discharge medications or the care you received while you were in the hospital after you are discharged, you can call the unit and asked to speak with the hospitalist on call if the hospitalist that took care of you is not available. Once you  are discharged, your primary care physician will handle any further medical issues. Please note that NO REFILLS for any discharge medications will be authorized once you are discharged, as it is imperative that you return to your primary care physician (or establish a relationship with a primary care physician if you do not have one) for your aftercare needs so that they can reassess your need for medications and monitor your lab values.   Increase activity slowly    Complete by:  As directed  Discharge Medications   Allergies as of 06/21/2016   No Known Allergies     Medication List    STOP taking these medications   ALEVE 220 MG tablet Generic drug:  naproxen sodium     TAKE these medications   albuterol (2.5 MG/3ML) 0.083% nebulizer solution Commonly known as:  PROVENTIL Take 3 mLs (2.5 mg total) by nebulization every 2 (two) hours as needed for wheezing.   amLODipine 10 MG tablet Commonly known as:  NORVASC Take 1 tablet (10 mg total) by mouth daily.   predniSONE 5 MG tablet Commonly known as:  DELTASONE Label  & dispense according to the schedule below. 10 Pills PO for 3 days then, 8 Pills PO for 3 days, 6 Pills PO for 3 days, 4 Pills PO for 3 days, 2 Pills PO for 3 days, 1 Pills PO for 3 days, 1/2 Pill  PO for 3 days then STOP. Total 95 pills.   tiotropium 18 MCG inhalation capsule Commonly known as:  SPIRIVA HANDIHALER Place 1 capsule (18 mcg total) into inhaler and inhale daily.            Durable Medical Equipment        Start     Ordered   06/21/16 0000  DME Nebulizer/meds    Question:  Patient needs a nebulizer to treat with the following condition  Answer:  COPD (chronic obstructive pulmonary disease) ([place])   06/21/16 [id number]   06/20/16 0923  For home use only DME oxygen  Once    Question Answer Comment  Mode or (Route) Nasal cannula   Liters per Minute 2   Frequency Continuous (stationary and portable oxygen unit needed)   Oxygen conserving device  Yes   Oxygen delivery system Gas      06/20/16 0922      Follow-up Information    [place]. Schedule an appointment as soon as possible for a visit in 1 week(s).   Contact information: [address] [place] [address] [phone]        HAWKINS,EDWARD L, MD. Schedule an appointment as soon as possible for a visit in 1 week(s).   Specialty:  Pulmonary Disease Why:  advanced COPD Contact information: [place] [address] [phone]           Major procedures and Radiology Reports - PLEASE review detailed and final reports thoroughly  -      Dg Chest 2 View  Result Date: 06/17/2016 CLINICAL DATA:  Gradually worsening shortness of breath. EXAM: CHEST  2 VIEW COMPARISON:  08/31/2013 FINDINGS: The lungs are clear wiithout focal pneumonia, edema, pneumothorax or pleural effusion. Interstitial markings are diffusely coarsened with chronic features. Hyperexpansion is consistent with emphysema. The cardiopericardial silhouette is within normal limits for size. The visualized bony structures of the thorax are intact. IMPRESSION: Emphysema without acute findings. Electronically Signed   By: Misty Stanley M.D.   On: 06/17/2016 14:45   Dg Chest Port 1 View  Result Date: 06/21/2016 CLINICAL DATA:  Shortness of breath . EXAM: PORTABLE CHEST 1 VIEW COMPARISON:  06/17/2016 . FINDINGS: Mediastinum and hilar structures normal. Lungs are clear. No pleural effusion or pneumothorax. Heart size normal. IMPRESSION: No acute cardiopulmonary disease. Electronically Signed   By: Marcello Moores  Register   On: 06/21/2016 07:43    Micro Results     No results found for this or any previous visit (from the past 240 hour(s)).  Today   Subjective  Marvin Davis today has no headache,no chest abdominal pain,no new weakness tingling or numbness, feels much better wants to go home today.     Objective   Blood pressure (!) 137/95, pulse 78,  temperature 97.8 F (36.6 C), temperature source Oral, resp. rate 20, height 5\' 5"  (1.651 m), weight 72.6 kg (160 lb 1.6 oz), SpO2 97 %.   Intake/Output Summary (Last 24 hours) at 06/21/16 0934 Last data filed at 06/21/16 0630  Gross per 24 hour  Intake              363 ml  Output             2200 ml  Net            -1837 ml    Exam Awake Alert, Oriented x 3, No new F.N deficits, Normal affect Branch.AT,PERRAL Supple Neck,No JVD, No cervical lymphadenopathy appriciated.  Symmetrical Chest wall movement, Mod air movement bilaterally, bilat mild wheezing RRR,No Gallops,Rubs or new Murmurs, No Parasternal Heave +ve B.Sounds, Abd Soft, Non tender, No organomegaly appriciated, No rebound -guarding or rigidity. No Cyanosis, Clubbing or edema, No new Rash or bruise   Data Review   CBC w Diff: Lab Results  Component Value Date   WBC 10.3 06/19/2016   HGB 15.6 06/19/2016   HCT 45.7 06/19/2016   PLT 127 (L) 06/19/2016   LYMPHOPCT 43 08/31/2013   MONOPCT 5 08/31/2013   EOSPCT 5 08/31/2013   BASOPCT 1 08/31/2013    CMP: Lab Results  Component Value Date   NA 138 06/18/2016   K 4.8 06/18/2016   CL 99 (L) 06/18/2016   CO2 32 06/18/2016   BUN 19 06/18/2016   CREATININE 0.90 06/18/2016  .   Total Time in preparing paper work, data evaluation and todays exam - 35 minutes  Thurnell Lose M.D on 06/21/2016 at 9:34 AM  [facility]   Office  [phone]

## 2016-06-21 NOTE — Discharge Instructions (Signed)
Follow with Primary MD Alphia Kava in 7 days (you will need to call to schedule!)  Get CBC, CMP, 2 view Chest X ray checked  by Primary MD or SNF MD in 5-7 days ( we routinely change or add medications that can affect your baseline labs and fluid status, therefore we recommend that you get the mentioned basic workup next visit with your PCP, your PCP may decide not to get them or add new tests based on their clinical decision)   Activity: As tolerated with Full fall precautions use walker/cane & assistance as needed   Disposition Home     Diet:   Heart Healthy    For Heart failure patients - Check your Weight same time everyday, if you gain over 2 pounds, or you develop in leg swelling, experience more shortness of breath or chest pain, call your Primary MD immediately. Follow Cardiac Low Salt Diet and 1.5 lit/day fluid restriction.   On your next visit with your primary care physician please Get Medicines reviewed and adjusted.   Please request your Prim.MD to go over all Hospital Tests and Procedure/Radiological results at the follow up, please get all Hospital records sent to your Prim MD by signing hospital release before you go home.   If you experience worsening of your admission symptoms, develop shortness of breath, life threatening emergency, suicidal or homicidal thoughts you must seek medical attention immediately by calling 911 or calling your MD immediately  if symptoms less severe.  You Must read complete instructions/literature along with all the possible adverse reactions/side effects for all the Medicines you take and that have been prescribed to you. Take any new Medicines after you have completely understood and accpet all the possible adverse reactions/side effects.   Do not drive, operate heavy machinery, perform activities at heights, swimming or participation in water activities or provide baby sitting services if your were admitted for syncope or  siezures until you have seen by Primary MD or a Neurologist and advised to do so again.  Do not drive when taking Pain medications.    Do not take more than prescribed Pain, Sleep and Anxiety Medications  Special Instructions: If you have smoked or chewed Tobacco  in the last 2 yrs please stop smoking, stop any regular Alcohol  and or any Recreational drug use.  Wear Seat belts while driving.   Please note  You were cared for by a hospitalist during your hospital stay. If you have any questions about your discharge medications or the care you received while you were in the hospital after you are discharged, you can call the unit and asked to speak with the hospitalist on call if the hospitalist that took care of you is not available. Once you are discharged, your primary care physician will handle any further medical issues. Please note that NO REFILLS for any discharge medications will be authorized once you are discharged, as it is imperative that you return to your primary care physician (or establish a relationship with a primary care physician if you do not have one) for your aftercare needs so that they can reassess your need for medications and monitor your lab values.

## 2016-06-21 NOTE — Care Management Note (Signed)
Case Management Note  Patient Details  Name: DELMORE DION MRN: [id number] Date of Birth: Jan 19, 1956     Expected Discharge Date:  06/21/16               Expected Discharge Plan:  Home/Self Care  In-House Referral:  NA  Discharge planning Services  CM Consult  Post Acute Care Choice:  Durable Medical Equipment Choice offered to:  Patient  DME Arranged:  Oxygen DME Agency:  [place]:    [facility] Agency:     Status of Service:  Completed, signed off  If discussed at [place] of Stay Meetings, dates discussed:    Additional Comments: Patient discharging today. He will need oxygen, neb machine from [facility]. FC to assess Medicaid/financial assistance possibilities. CM provided MATCH voucher and made a PCP appointment with Dr. Maudie Mercury to establish care. Patient aware that first visit will be $150 and each visit there after will be $60.   Elbia Paro, Chauncey Reading, RN 06/21/2016, 10:49 AM

## 2016-06-21 NOTE — Progress Notes (Signed)
IV removed, site WNL.  Pt given d/c instructions and new prescriptions.  Discussed all home medications (when, how, and why to take), patient verbalizes understanding. Was concerned about paying for meds, so Case Mgt was able to give Match Voucher. Discussed home care with patient, activity and smoking cessation, teachback completed. F/U appointment in place, pt states they will keep appointment. Pt is stable at this time. Waiting for nebulizer and oxygen to arrive. Pt understands no one in the house should smoke near the oxygen.

## 2016-06-24 ENCOUNTER — Telehealth: Payer: Self-pay

## 2016-06-24 NOTE — Telephone Encounter (Signed)
Client called RN to follow up after discharge from the hospital at [facility]. Client states he is now on home oxygen and medications and is feeling some better and has decreased swelling in his legs. He states that the hospital staff arranged an appointment with Dr Maudie Mercury to follow up for primary care. Client states he will call RN at [facility] for any further needs or resources. Client states appreciation for the staff at [facility] center's concern and caring. Client to follow up with [place] community care: Mikle Bosworth as needed for navigational resources or needs for other community resources.

## 2016-07-30 ENCOUNTER — Telehealth: Payer: Self-pay

## 2016-07-30 NOTE — Telephone Encounter (Signed)
Client [facility] for 07/31/16 at 11:00am. RN informed client to arrive at 1045 for paperwork to be completed. RN also reminded client that he needs to contact Adline Potter at [facility] today to inquire about his application for financial assistance. Client states he will do so. Will follow up with client after his appointment tomorrow.

## 2016-07-30 NOTE — Telephone Encounter (Signed)
Client called today stating that since he was discharged from the hospital he has not been able to have a primary care appointment with the doctors he was referred to from the hospital. He states they keep rescheduling him and asking for a $150.00 co-payment that he does not have. He has stated he has no income. He reports he is now out of all his medications and has no refills on any of them. He is asking for a referral into the [facility] as we discussed at our first meeting before his hospitalization. RN will call and attempt to make an ASAP appointment for client. RN also recommended that client contact Adline Potter at [facility] to make sure his application for financial assistance is in progress and if she needs any further information. Client states he has her contact information and will contact her today. RN will call client back with appointment.

## 2016-07-31 ENCOUNTER — Encounter: Payer: Self-pay | Admitting: Physician Assistant

## 2016-07-31 ENCOUNTER — Telehealth: Payer: Self-pay

## 2016-07-31 ENCOUNTER — Ambulatory Visit: Payer: Self-pay | Admitting: Physician Assistant

## 2016-07-31 VITALS — BP 144/86 | HR 96 | Temp 97.9°F | Ht 65.0 in | Wt 177.8 lb

## 2016-07-31 DIAGNOSIS — D696 Thrombocytopenia, unspecified: Secondary | ICD-10-CM

## 2016-07-31 DIAGNOSIS — Z125 Encounter for screening for malignant neoplasm of prostate: Secondary | ICD-10-CM

## 2016-07-31 DIAGNOSIS — R7981 Abnormal blood-gas level: Secondary | ICD-10-CM

## 2016-07-31 DIAGNOSIS — I10 Essential (primary) hypertension: Secondary | ICD-10-CM

## 2016-07-31 DIAGNOSIS — J441 Chronic obstructive pulmonary disease with (acute) exacerbation: Secondary | ICD-10-CM

## 2016-07-31 DIAGNOSIS — Z131 Encounter for screening for diabetes mellitus: Secondary | ICD-10-CM

## 2016-07-31 DIAGNOSIS — R609 Edema, unspecified: Secondary | ICD-10-CM

## 2016-07-31 DIAGNOSIS — R062 Wheezing: Secondary | ICD-10-CM

## 2016-07-31 LAB — GLUCOSE, POCT (MANUAL RESULT ENTRY): POC Glucose: 106 mg/dl — AB (ref 70–99)

## 2016-07-31 MED ORDER — MOMETASONE FURO-FORMOTEROL FUM 200-5 MCG/ACT IN AERO: 2.0000 | INHALATION_SPRAY | Freq: Two times a day (BID) | RESPIRATORY_TRACT | 3 refills | Status: DC

## 2016-07-31 MED ORDER — ALBUTEROL SULFATE HFA 108 (90 BASE) MCG/ACT IN AERS: 2.0000 | INHALATION_SPRAY | Freq: Four times a day (QID) | RESPIRATORY_TRACT | 2 refills | Status: DC | PRN

## 2016-07-31 MED ORDER — MOMETASONE FURO-FORMOTEROL FUM 200-5 MCG/ACT IN AERO: 2.0000 | INHALATION_SPRAY | Freq: Two times a day (BID) | RESPIRATORY_TRACT | 0 refills | Status: DC

## 2016-07-31 MED ORDER — ALBUTEROL SULFATE (2.5 MG/3ML) 0.083% IN NEBU
2.5000 mg | INHALATION_SOLUTION | Freq: Once | RESPIRATORY_TRACT | Status: AC
  Administered 2016-07-31: 2.5 mg via RESPIRATORY_TRACT

## 2016-07-31 MED ORDER — LISINOPRIL-HYDROCHLOROTHIAZIDE 10-12.5 MG PO TABS: 1.0000 | ORAL_TABLET | Freq: Every day | ORAL | 1 refills | Status: DC

## 2016-07-31 MED ORDER — METHYLPREDNISOLONE ACETATE 80 MG/ML IJ SUSP
80.0000 mg | Freq: Once | INTRAMUSCULAR | Status: AC
  Administered 2016-07-31: 80 mg via INTRAMUSCULAR

## 2016-07-31 MED ORDER — ALBUTEROL SULFATE (2.5 MG/3ML) 0.083% IN NEBU: 2.5000 mg | INHALATION_SOLUTION | RESPIRATORY_TRACT | 3 refills | Status: DC | PRN

## 2016-07-31 NOTE — Telephone Encounter (Signed)
Called to follow up with client after his appointment with [facility].  No answer, left voicemail for client to return call.

## 2016-07-31 NOTE — Telephone Encounter (Signed)
Return call to client , regarding follow up from his new patient appointment with the free clinic. Client states everything went well and he was able to get new prescriptions and back on his medications. Client states the staff at the [facility] was very nice and caring. Client is to get lab work as ordered by provider and then has a follow up appointment. Will follow up as needed. Client has RN contact information.

## 2016-07-31 NOTE — Progress Notes (Signed)
BP (!) 144/86 (BP Location: Left Arm, Patient Position: Sitting, Cuff Size: Normal)   Pulse 96   Temp 97.9 F (36.6 C)   Ht 5\' 5"  (1.651 m)   Wt 177 lb 12 oz (80.6 kg)   SpO2 (!) 87%   BMI 29.58 kg/m    Subjective:    Patient ID: DONNAVAN COVAULT, male    DOB: [birth year], 61 y.o.   MRN: [id number]  HPI: ITZAEL LIPTAK is a 61 y.o. male presenting on 07/31/2016 for New Patient (Initial Visit) and Wheezing (pt states feeling SOB)   HPI    Pt in hospital in January.  He Was scheduled to see Dr Jani Gravel and also dr Everette Rank and Dr Lanetta Inch but hasn't seen any of them because he had no money to give to them  He is Out of all meds except oxygen.  He ran Out of breathing meds 3-4 days ago  After 1st neb treatment, o2 sat still 86-87% on 2L oxygen. After 2nd nebulizer treatement, O2 sat 89-90% on 2L .  After 3rd neb treatment, O2 saturation went to 93%  Relevant past medical, surgical, family and social history reviewed and updated as indicated. Interim medical history since our last visit reviewed. Allergies and medications reviewed and updated.   Current Outpatient Prescriptions:  .  OXYGEN, Inhale 2 L into the lungs daily., Disp: , Rfl:  .  albuterol (PROVENTIL) (2.5 MG/3ML) 0.083% nebulizer solution, Take 3 mLs (2.5 mg total) by nebulization every 2 (two) hours as needed for wheezing. (Patient not taking: Reported on 07/31/2016), Disp: 75 mL, Rfl: 0 .  amLODipine (NORVASC) 10 MG tablet, Take 1 tablet (10 mg total) by mouth daily. (Patient not taking: Reported on 07/31/2016), Disp: 30 tablet, Rfl: 0 .  predniSONE (DELTASONE) 5 MG tablet, Label  & dispense according to the schedule below. 10 Pills PO for 3 days then, 8 Pills PO for 3 days, 6 Pills PO for 3 days, 4 Pills PO for 3 days, 2 Pills PO for 3 days, 1 Pills PO for 3 days, 1/2 Pill  PO for 3 days then STOP. Total 95 pills. (Patient not taking: Reported on 07/31/2016), Disp: 95 tablet, Rfl: 0 .  tiotropium (SPIRIVA HANDIHALER)  18 MCG inhalation capsule, Place 1 capsule (18 mcg total) into inhaler and inhale daily. (Patient not taking: Reported on 07/31/2016), Disp: 30 capsule, Rfl: 0  Current Facility-Administered Medications:  .  methylPREDNISolone acetate (DEPO-MEDROL) injection 80 mg, 80 mg, Intramuscular, Once, Soyla Dryer, PA-C   Review of Systems  Constitutional: Negative for appetite change, chills, diaphoresis, fatigue, fever and unexpected weight change.  HENT: Negative for congestion, dental problem, drooling, ear pain, facial swelling, hearing loss, mouth sores, sneezing, sore throat, trouble swallowing and voice change.   Eyes: Negative for pain, discharge, redness, itching and visual disturbance.  Respiratory: Positive for shortness of breath and wheezing. Negative for cough and choking.   Cardiovascular: Positive for leg swelling. Negative for chest pain and palpitations.  Gastrointestinal: Negative for abdominal pain, blood in stool, constipation, diarrhea and vomiting.  Endocrine: Negative for cold intolerance, heat intolerance and polydipsia.  Genitourinary: Negative for decreased urine volume, dysuria and hematuria.  Musculoskeletal: Negative for arthralgias, back pain and gait problem.  Skin: Negative for rash.  Allergic/Immunologic: Negative for environmental allergies.  Neurological: Negative for seizures, syncope, light-headedness and headaches.  Hematological: Negative for adenopathy.  Psychiatric/Behavioral: Negative for agitation, dysphoric mood and suicidal ideas. The patient is not nervous/anxious.  Per HPI unless specifically indicated above     Objective:    BP (!) 144/86 (BP Location: Left Arm, Patient Position: Sitting, Cuff Size: Normal)   Pulse 96   Temp 97.9 F (36.6 C)   Ht 5\' 5"  (1.651 m)   Wt 177 lb 12 oz (80.6 kg)   SpO2 (!) 87%   BMI 29.58 kg/m   Wt Readings from Last 3 Encounters:  07/31/16 177 lb 12 oz (80.6 kg)  06/21/16 160 lb 1.6 oz (72.6 kg)   06/17/16 166 lb 9.6 oz (75.6 kg)    Physical Exam  Constitutional: He is oriented to person, place, and time. He appears well-developed and well-nourished.  Neck: Neck supple.  Cardiovascular: Normal rate, regular rhythm and normal heart sounds.   Pulmonary/Chest: Tachypnea (RR 22) noted. No bradypnea. No respiratory distress. He has wheezes. He has no rhonchi. He has no rales.  Musculoskeletal: He exhibits edema.  Neurological: He is alert and oriented to person, place, and time.  Skin: Skin is warm and dry.  Psychiatric: He has a normal mood and affect. His behavior is normal. Thought content normal.  Nursing note and vitals reviewed.   Results for orders placed or performed in visit on 07/31/16  POCT Glucose (CBG)  Result Value Ref Range   POC Glucose 106 (A) 70 - 99 mg/dl      Assessment & Plan:    Encounter Diagnoses  Name Primary?  Marland Kitchen COPD exacerbation ([place]) Yes  . Wheezing   . Low O2 saturation   . Screening for diabetes mellitus   . Screening for prostate cancer   . Edema, unspecified type   . Thrombocytopenia ([place])   . Essential hypertension     -Will get pt set up for [facility] to get meds at no cost -Pt does not want to return to the ER- gave depo-medrol 80mg  IM in offce.  Gave Sample dulera. rx albuterol for nebulizer --pt to get Fasting labs drawn by Saturday morning at the latest -rx lisinopril/hctz for bp- d/c amlodipine. rx given -F/u next week for recheck.  Pt to go to ER if worsens or new sypmtoms

## 2016-07-31 NOTE — Patient Instructions (Addendum)
Get fasting labs/bloodwork (mon-fri- 7am, sat 8am)  Use sample dulera- 2 puffs twice daily  Continue with your oxygen  Get on blood pressure medication- lisinopril/hctz  Pick up nebulizer medication

## 2016-08-05 ENCOUNTER — Other Ambulatory Visit: Payer: Self-pay | Admitting: Physician Assistant

## 2016-08-05 LAB — COMPREHENSIVE METABOLIC PANEL
ALBUMIN: 4.2 g/dL (ref 3.6–5.1)
ALT: 19 U/L (ref 9–46)
AST: 21 U/L (ref 10–35)
Alkaline Phosphatase: 56 U/L (ref 40–115)
BUN: 16 mg/dL (ref 7–25)
CALCIUM: 9 mg/dL (ref 8.6–10.3)
CO2: 33 mmol/L — ABNORMAL HIGH (ref 20–31)
Chloride: 100 mmol/L (ref 98–110)
Creat: 0.99 mg/dL (ref 0.70–1.25)
Glucose, Bld: 101 mg/dL — ABNORMAL HIGH (ref 65–99)
Potassium: 4.1 mmol/L (ref 3.5–5.3)
SODIUM: 142 mmol/L (ref 135–146)
Total Bilirubin: 0.5 mg/dL (ref 0.2–1.2)
Total Protein: 6.5 g/dL (ref 6.1–8.1)

## 2016-08-05 LAB — CBC
HEMATOCRIT: 39.6 % (ref 38.5–50.0)
Hemoglobin: 13.2 g/dL (ref 13.2–17.1)
MCH: 26.7 pg — ABNORMAL LOW (ref 27.0–33.0)
MCHC: 33.3 g/dL (ref 32.0–36.0)
MCV: 80.2 fL (ref 80.0–100.0)
Platelets: 106 10*3/uL — ABNORMAL LOW (ref 140–400)
RBC: 4.94 MIL/uL (ref 4.20–5.80)
RDW: 17.1 % — AB (ref 11.0–15.0)
WBC: 6.1 10*3/uL (ref 3.8–10.8)

## 2016-08-05 LAB — PSA: PSA: 0.3 ng/mL (ref <=4.0)

## 2016-08-06 ENCOUNTER — Ambulatory Visit: Payer: Self-pay | Admitting: Physician Assistant

## 2016-08-06 ENCOUNTER — Encounter: Payer: Self-pay | Admitting: Physician Assistant

## 2016-08-06 VITALS — BP 142/78 | HR 87 | Temp 97.9°F | Ht 65.0 in | Wt 174.5 lb

## 2016-08-06 DIAGNOSIS — D696 Thrombocytopenia, unspecified: Secondary | ICD-10-CM

## 2016-08-06 DIAGNOSIS — Z9981 Dependence on supplemental oxygen: Secondary | ICD-10-CM

## 2016-08-06 DIAGNOSIS — I10 Essential (primary) hypertension: Secondary | ICD-10-CM

## 2016-08-06 DIAGNOSIS — J449 Chronic obstructive pulmonary disease, unspecified: Secondary | ICD-10-CM

## 2016-08-06 DIAGNOSIS — R062 Wheezing: Secondary | ICD-10-CM

## 2016-08-06 LAB — LIPID PANEL
CHOL/HDL RATIO: 1.8 ratio (ref <5.0)
Cholesterol: 170 mg/dL (ref <200)
HDL: 92 mg/dL (ref >40)
LDL Cholesterol: 67 mg/dL (ref <100)
Triglycerides: 54 mg/dL (ref <150)
VLDL: 11 mg/dL (ref <30)

## 2016-08-06 LAB — HEMOGLOBIN A1C
Hgb A1c MFr Bld: 5.9 % — ABNORMAL HIGH (ref <5.7)
Mean Plasma Glucose: 123 mg/dL

## 2016-08-06 MED ORDER — BENZONATATE 100 MG PO CAPS: ORAL_CAPSULE | ORAL | 2 refills | Status: DC

## 2016-08-06 NOTE — Progress Notes (Signed)
BP (!) 142/78 (BP Location: Left Arm, Patient Position: Sitting, Cuff Size: Normal)   Pulse 87   Temp 97.9 F (36.6 C)   Ht 5\' 5"  (1.651 m)   Wt 174 lb 8 oz (79.2 kg)   SpO2 95%   BMI 29.04 kg/m    Subjective:    Patient ID: Marvin Davis, male    DOB: September 21, 1955, 61 y.o.   MRN: [id number]  HPI: Marvin Davis is a 61 y.o. male presenting on 08/06/2016 for Shortness of Breath   HPI   Pt states caught a cold on Sunday and having a cough and some phlegm and aching.  No fevers.    Pt states he is feeling much better than he did when he was here for his new pt appointment  Pt states he is using nebulizer twice most days.  He says he doesn't have problems with wheezing usless he is up moving about.    Pt states he will be getting medicare in June.  Relevant past medical, surgical, family and social history reviewed and updated as indicated. Interim medical history since our last visit reviewed. Allergies and medications reviewed and updated.   Current Outpatient Prescriptions:  .  albuterol (PROVENTIL HFA;VENTOLIN HFA) 108 (90 Base) MCG/ACT inhaler, Inhale 2 puffs into the lungs every 6 (six) hours as needed for wheezing or shortness of breath., Disp: 1 Inhaler, Rfl: 2 .  albuterol (PROVENTIL) (2.5 MG/3ML) 0.083% nebulizer solution, Take 3 mLs (2.5 mg total) by nebulization every 4 (four) hours as needed for wheezing or shortness of breath., Disp: 150 mL, Rfl: 3 .  lisinopril-hydrochlorothiazide (PRINZIDE,ZESTORETIC) 10-12.5 MG tablet, Take 1 tablet by mouth daily., Disp: 30 tablet, Rfl: 1 .  mometasone-formoterol (DULERA) 200-5 MCG/ACT AERO, Inhale 2 puffs into the lungs 2 (two) times daily., Disp: 1 Inhaler, Rfl: 0 .  OXYGEN, Inhale 2 L into the lungs daily., Disp: , Rfl:  .  mometasone-formoterol (DULERA) 200-5 MCG/ACT AERO, Inhale 2 puffs into the lungs 2 (two) times daily. (Patient not taking: Reported on 08/06/2016), Disp: 3 Inhaler, Rfl: 3   Review of Systems   Constitutional: Negative for appetite change, chills, diaphoresis, fatigue, fever and unexpected weight change.  HENT: Negative for congestion, dental problem, drooling, ear pain, facial swelling, hearing loss, mouth sores, sneezing, sore throat, trouble swallowing and voice change.   Eyes: Negative for pain, discharge, redness, itching and visual disturbance.  Respiratory: Positive for cough and wheezing. Negative for choking and shortness of breath.   Cardiovascular: Positive for leg swelling. Negative for chest pain and palpitations.  Gastrointestinal: Negative for abdominal pain, blood in stool, constipation, diarrhea and vomiting.  Endocrine: Negative for cold intolerance, heat intolerance and polydipsia.  Genitourinary: Negative for decreased urine volume, dysuria and hematuria.  Musculoskeletal: Negative for arthralgias, back pain and gait problem.  Skin: Negative for rash.  Allergic/Immunologic: Negative for environmental allergies.  Neurological: Negative for seizures, syncope, light-headedness and headaches.  Hematological: Negative for adenopathy.  Psychiatric/Behavioral: Negative for agitation, dysphoric mood and suicidal ideas. The patient is not nervous/anxious.     Per HPI unless specifically indicated above     Objective:    BP (!) 142/78 (BP Location: Left Arm, Patient Position: Sitting, Cuff Size: Normal)   Pulse 87   Temp 97.9 F (36.6 C)   Ht 5\' 5"  (1.651 m)   Wt 174 lb 8 oz (79.2 kg)   SpO2 95%   BMI 29.04 kg/m   Wt Readings from Last 3 Encounters:  08/06/16 174 lb 8 oz (79.2 kg)  07/31/16 177 lb 12 oz (80.6 kg)  06/21/16 160 lb 1.6 oz (72.6 kg)    Physical Exam  Constitutional: He is oriented to person, place, and time. He appears well-developed and well-nourished.  HENT:  Head: Normocephalic and atraumatic.  Neck: Neck supple.  Cardiovascular: Normal rate and regular rhythm.   Pulmonary/Chest: Effort normal. No tachypnea and no bradypnea. No  respiratory distress. He has wheezes. He has no rhonchi. He has no rales.  Abdominal: Soft. Bowel sounds are normal. There is no hepatosplenomegaly. There is no tenderness.  Musculoskeletal: He exhibits no edema.  Lymphadenopathy:    He has no cervical adenopathy.  Neurological: He is alert and oriented to person, place, and time.  Skin: Skin is warm and dry.  Psychiatric: He has a normal mood and affect. His behavior is normal.  Vitals reviewed.   Results for orders placed or performed in visit on 07/31/16  PSA  Result Value Ref Range   PSA 0.3 <=4.0 ng/mL  Comprehensive metabolic panel  Result Value Ref Range   Sodium 142 135 - 146 mmol/L   Potassium 4.1 3.5 - 5.3 mmol/L   Chloride 100 98 - 110 mmol/L   CO2 33 (H) 20 - 31 mmol/L   Glucose, Bld 101 (H) 65 - 99 mg/dL   BUN 16 7 - 25 mg/dL   Creat 0.99 0.70 - 1.25 mg/dL   Total Bilirubin 0.5 0.2 - 1.2 mg/dL   Alkaline Phosphatase 56 40 - 115 U/L   AST 21 10 - 35 U/L   ALT 19 9 - 46 U/L   Total Protein 6.5 6.1 - 8.1 g/dL   Albumin 4.2 3.6 - 5.1 g/dL   Calcium 9.0 8.6 - 10.3 mg/dL  CBC  Result Value Ref Range   WBC 6.1 3.8 - 10.8 K/uL   RBC 4.94 4.20 - 5.80 MIL/uL   Hemoglobin 13.2 13.2 - 17.1 g/dL   HCT 39.6 38.5 - 50.0 %   MCV 80.2 80.0 - 100.0 fL   MCH 26.7 (L) 27.0 - 33.0 pg   MCHC 33.3 32.0 - 36.0 g/dL   RDW 17.1 (H) 11.0 - 15.0 %   Platelets 106 (L) 140 - 400 K/uL  HgB A1c  Result Value Ref Range   Hgb A1c MFr Bld 5.9 (H) <5.7 %   Mean Plasma Glucose 123 mg/dL  POCT Glucose (CBG)  Result Value Ref Range   POC Glucose 106 (A) 70 - 99 mg/dl      Assessment & Plan:   Encounter Diagnoses  Name Primary?  . Essential hypertension Yes  . Chronic obstructive pulmonary disease, unspecified COPD type ([place])   . Wheezing   . Thrombocytopenia ([place])   . Dependence on supplemental oxygen     -reviewed labs with pt -discussed Low platelets with pt- possibly due to long term smoking- will recheck in one month.  If not  improving, will discuss referral to hematology for evaluation.  Pt in agreement -Add lipids to labs -Gave cone discount application - will refer to pulmonology for recommendations on management -pt counseled to Increase nebs to 4 times daily if he is wheezing -rx tessalon to use prn cough.  -no changes to bp medications since just started new Rx 1 week ago -follow up 1 mo to recheck htn, copd

## 2016-08-14 ENCOUNTER — Other Ambulatory Visit: Payer: Self-pay | Admitting: Physician Assistant

## 2016-08-14 DIAGNOSIS — D696 Thrombocytopenia, unspecified: Secondary | ICD-10-CM

## 2016-08-26 ENCOUNTER — Institutional Professional Consult (permissible substitution): Payer: Self-pay | Admitting: Pulmonary Disease

## 2016-08-28 ENCOUNTER — Other Ambulatory Visit: Payer: Self-pay | Admitting: Student

## 2016-08-28 DIAGNOSIS — D696 Thrombocytopenia, unspecified: Secondary | ICD-10-CM

## 2016-08-28 LAB — CBC
HCT: 37.5 % — ABNORMAL LOW (ref 38.5–50.0)
Hemoglobin: 12.6 g/dL — ABNORMAL LOW (ref 13.2–17.1)
MCH: 26.5 pg — AB (ref 27.0–33.0)
MCHC: 33.6 g/dL (ref 32.0–36.0)
MCV: 78.9 fL — AB (ref 80.0–100.0)
PLATELETS: 110 10*3/uL — AB (ref 140–400)
RBC: 4.75 MIL/uL (ref 4.20–5.80)
RDW: 17.6 % — AB (ref 11.0–15.0)
WBC: 3.9 10*3/uL (ref 3.8–10.8)

## 2016-09-02 ENCOUNTER — Ambulatory Visit: Payer: Self-pay | Admitting: Physician Assistant

## 2016-09-02 ENCOUNTER — Encounter: Payer: Self-pay | Admitting: Physician Assistant

## 2016-09-02 ENCOUNTER — Other Ambulatory Visit: Payer: Self-pay | Admitting: Physician Assistant

## 2016-09-02 VITALS — BP 150/96 | HR 85 | Temp 97.9°F | Ht 65.0 in | Wt 172.0 lb

## 2016-09-02 DIAGNOSIS — D696 Thrombocytopenia, unspecified: Secondary | ICD-10-CM

## 2016-09-02 DIAGNOSIS — J449 Chronic obstructive pulmonary disease, unspecified: Secondary | ICD-10-CM

## 2016-09-02 DIAGNOSIS — Z1211 Encounter for screening for malignant neoplasm of colon: Secondary | ICD-10-CM

## 2016-09-02 DIAGNOSIS — I10 Essential (primary) hypertension: Secondary | ICD-10-CM

## 2016-09-02 DIAGNOSIS — D649 Anemia, unspecified: Secondary | ICD-10-CM

## 2016-09-02 MED ORDER — AMLODIPINE BESYLATE 10 MG PO TABS: 10.0000 mg | ORAL_TABLET | Freq: Every day | ORAL | 1 refills | Status: DC

## 2016-09-02 NOTE — Progress Notes (Signed)
BP (!) 150/96 (BP Location: Left Arm, Patient Position: Sitting, Cuff Size: Large)   Pulse 85   Temp 97.9 F (36.6 C) (Other (Comment))   Ht 5\' 5"  (1.651 m)   Wt 172 lb (78 kg)   SpO2 95%   BMI 28.62 kg/m    Subjective:    Patient ID: Marvin Davis, male    DOB: 1955-11-01, 61 y.o.   MRN: [id number]  HPI: Marvin Davis is a 61 y.o. male presenting on 09/02/2016 for Hypertension and COPD   HPI  Pt did not turn in cone discount application.  He is supposed to get medicare in June but discussed that we need to get him to pulmologist sooner than that and he agrees  Pt says he only has one inhaler- it is dulera.    Pt says tessalon helped a lot for his cough but he didn't know it had refills.    He had the cough since before he came to [facility].    Pt states never had colonoscopy  Relevant past medical, surgical, family and social history reviewed and updated as indicated. Interim medical history since our last visit reviewed. Allergies and medications reviewed and updated.   Current Outpatient Prescriptions:  .  albuterol (PROVENTIL) (2.5 MG/3ML) 0.083% nebulizer solution, Take 3 mLs (2.5 mg total) by nebulization every 4 (four) hours as needed for wheezing or shortness of breath., Disp: 150 mL, Rfl: 3 .  lisinopril-hydrochlorothiazide (PRINZIDE,ZESTORETIC) 10-12.5 MG tablet, Take 1 tablet by mouth daily., Disp: 30 tablet, Rfl: 1 .  mometasone-formoterol (DULERA) 200-5 MCG/ACT AERO, Inhale 2 puffs into the lungs 2 (two) times daily., Disp: 3 Inhaler, Rfl: 3 .  OXYGEN, Inhale 2 L into the lungs daily., Disp: , Rfl:  .  albuterol (PROVENTIL HFA;VENTOLIN HFA) 108 (90 Base) MCG/ACT inhaler, Inhale 2 puffs into the lungs every 6 (six) hours as needed for wheezing or shortness of breath. (Patient not taking: Reported on 09/02/2016), Disp: 1 Inhaler, Rfl: 2 .  benzonatate (TESSALON) 100 MG capsule, 1- 2  PO q 8 hour prn cough (Patient not taking: Reported on 09/02/2016), Disp: 20  capsule, Rfl: 2   Review of Systems  Constitutional: Negative for appetite change, chills, diaphoresis, fatigue, fever and unexpected weight change.  HENT: Negative for congestion, dental problem, drooling, ear pain, facial swelling, hearing loss, mouth sores, sneezing, sore throat, trouble swallowing and voice change.   Eyes: Negative for pain, discharge, redness, itching and visual disturbance.  Respiratory: Positive for cough. Negative for choking, shortness of breath and wheezing.   Cardiovascular: Negative for chest pain, palpitations and leg swelling.  Gastrointestinal: Negative for abdominal pain, blood in stool, constipation, diarrhea and vomiting.  Endocrine: Negative for cold intolerance, heat intolerance and polydipsia.  Genitourinary: Negative for decreased urine volume, dysuria and hematuria.  Musculoskeletal: Negative for arthralgias, back pain and gait problem.  Skin: Negative for rash.  Allergic/Immunologic: Negative for environmental allergies.  Neurological: Negative for seizures, syncope, light-headedness and headaches.  Hematological: Negative for adenopathy.  Psychiatric/Behavioral: Negative for agitation, dysphoric mood and suicidal ideas. The patient is not nervous/anxious.     Per HPI unless specifically indicated above     Objective:    BP (!) 150/96 (BP Location: Left Arm, Patient Position: Sitting, Cuff Size: Large)   Pulse 85   Temp 97.9 F (36.6 C) (Other (Comment))   Ht 5\' 5"  (1.651 m)   Wt 172 lb (78 kg)   SpO2 95%   BMI 28.62 kg/m  Wt Readings from Last 3 Encounters:  09/02/16 172 lb (78 kg)  08/06/16 174 lb 8 oz (79.2 kg)  07/31/16 177 lb 12 oz (80.6 kg)    Physical Exam  Constitutional: He is oriented to person, place, and time. He appears well-developed and well-nourished.  HENT:  Head: Normocephalic and atraumatic.  Neck: Neck supple.  Cardiovascular: Normal rate and regular rhythm.   Pulmonary/Chest: Effort normal and breath sounds  normal. He has no wheezes.  Abdominal: Soft. Bowel sounds are normal. There is no hepatosplenomegaly. There is no tenderness.  Musculoskeletal: He exhibits no edema.  Lymphadenopathy:    He has no cervical adenopathy.  Neurological: He is alert and oriented to person, place, and time.  Skin: Skin is warm and dry.  Psychiatric: He has a normal mood and affect. His behavior is normal.  Vitals reviewed.   Results for orders placed or performed in visit on 08/28/16  CBC  Result Value Ref Range   WBC 3.9 3.8 - 10.8 K/uL   RBC 4.75 4.20 - 5.80 MIL/uL   Hemoglobin 12.6 (L) 13.2 - 17.1 g/dL   HCT 37.5 (L) 38.5 - 50.0 %   MCV 78.9 (L) 80.0 - 100.0 fL   MCH 26.5 (L) 27.0 - 33.0 pg   MCHC 33.6 32.0 - 36.0 g/dL   RDW 17.6 (H) 11.0 - 15.0 %   Platelets 110 (L) 140 - 400 K/uL      Assessment & Plan:   Encounter Diagnoses  Name Primary?  . Essential hypertension Yes  . Chronic obstructive pulmonary disease, unspecified COPD type ([place])   . Thrombocytopenia ([place])   . Anemia, unspecified type   . Special screening for malignant neoplasm of colon     -reviewed labs with pt -Gave iFOBT for colon cancer screening.  Discussed with pt that will not wait until June when he has medicare and can get colonoscopy in light of recent decrease in hg.  Pt agrees -Urged pt to turn in cone discount application -Referral to pulmology in [place] -Add amlodipine for bp.  When pt gets it (in the mail from [place]), he is to stop lisinopril/hctz as this may be contributing to his cough. He can refill tessalon if desired.  Pt states understanding. -discussed with pt that he may need another referral depending on results of iFOBT.  He says he will get test returned this week -pt to follow up 4 weeks.  RTO sooner prn

## 2016-09-02 NOTE — Patient Instructions (Addendum)
When you get the amlodipine in the mail, start taking it and stop the lisinopril/hctz.  Turn in your cone discount application

## 2016-09-30 ENCOUNTER — Ambulatory Visit: Payer: Self-pay | Admitting: Physician Assistant

## 2016-09-30 ENCOUNTER — Encounter: Payer: Self-pay | Admitting: Physician Assistant

## 2016-09-30 VITALS — BP 118/74 | HR 89 | Temp 97.7°F | Ht 65.0 in | Wt 172.0 lb

## 2016-09-30 DIAGNOSIS — D649 Anemia, unspecified: Secondary | ICD-10-CM

## 2016-09-30 DIAGNOSIS — I10 Essential (primary) hypertension: Secondary | ICD-10-CM

## 2016-09-30 DIAGNOSIS — D696 Thrombocytopenia, unspecified: Secondary | ICD-10-CM

## 2016-09-30 DIAGNOSIS — J449 Chronic obstructive pulmonary disease, unspecified: Secondary | ICD-10-CM

## 2016-09-30 LAB — IFOBT (OCCULT BLOOD): IMMUNOLOGICAL FECAL OCCULT BLOOD TEST: NEGATIVE

## 2016-09-30 MED ORDER — ALBUTEROL SULFATE (2.5 MG/3ML) 0.083% IN NEBU: 2.5000 mg | INHALATION_SOLUTION | RESPIRATORY_TRACT | 3 refills | Status: DC | PRN

## 2016-09-30 MED ORDER — ALBUTEROL SULFATE (2.5 MG/3ML) 0.083% IN NEBU: 2.5000 mg | INHALATION_SOLUTION | RESPIRATORY_TRACT | 0 refills | Status: DC | PRN

## 2016-09-30 MED ORDER — BENZONATATE 100 MG PO CAPS: ORAL_CAPSULE | ORAL | 2 refills | Status: DC

## 2016-09-30 NOTE — Progress Notes (Signed)
BP 118/74 (BP Location: Left Arm, Patient Position: Sitting, Cuff Size: Normal)   Pulse 89   Temp 97.7 F (36.5 C) (Other (Comment))   Ht 5\' 5"  (1.651 m)   Wt 172 lb (78 kg)   SpO2 92%   BMI 28.62 kg/m    Subjective:    Patient ID: Marvin Davis, male    DOB: [birth year], 61 y.o.   MRN: [id number]  HPI: Marvin Davis is a 61 y.o. male presenting on 09/30/2016 for Hypertension; Anemia; and COPD   HPI   Pt is still hoping to get insurance in june  Pt has not turned in cone discount application yet.  He says he can't ever get any answer when he calls.   Pt says his breathing has been okay.  He says he ran out of his nebulizer medication.   Pt says the only time he coughs now is after he uses his inhaler.   He requests refill of tessalon.   Pt is out of his nebulizer medication.  He didn't call pharmacy for refill.   Relevant past medical, surgical, family and social history reviewed and updated as indicated. Interim medical history since our last visit reviewed. Allergies and medications reviewed and updated.   Current Outpatient Prescriptions:  .  albuterol (PROVENTIL HFA;VENTOLIN HFA) 108 (90 Base) MCG/ACT inhaler, Inhale 2 puffs into the lungs every 6 (six) hours as needed for wheezing or shortness of breath., Disp: 1 Inhaler, Rfl: 2 .  amLODipine (NORVASC) 10 MG tablet, Take 1 tablet (10 mg total) by mouth daily., Disp: 90 tablet, Rfl: 1 .  mometasone-formoterol (DULERA) 200-5 MCG/ACT AERO, Inhale 2 puffs into the lungs 2 (two) times daily., Disp: 3 Inhaler, Rfl: 3 .  OXYGEN, Inhale 2 L into the lungs daily., Disp: , Rfl:  .  albuterol (PROVENTIL) (2.5 MG/3ML) 0.083% nebulizer solution, Take 3 mLs (2.5 mg total) by nebulization every 4 (four) hours as needed for wheezing or shortness of breath. (Patient not taking: Reported on 09/30/2016), Disp: 150 mL, Rfl: 3 .  benzonatate (TESSALON) 100 MG capsule, 1- 2  PO q 8 hour prn cough (Patient not taking: Reported on  09/02/2016), Disp: 20 capsule, Rfl: 2  Review of Systems  Constitutional: Negative for appetite change, chills, diaphoresis, fatigue, fever and unexpected weight change.  HENT: Negative for congestion, dental problem, drooling, ear pain, facial swelling, hearing loss, mouth sores, sneezing, sore throat, trouble swallowing and voice change.   Eyes: Negative for pain, discharge, redness, itching and visual disturbance.  Respiratory: Negative for cough, choking, shortness of breath and wheezing.   Cardiovascular: Negative for chest pain, palpitations and leg swelling.  Gastrointestinal: Negative for abdominal pain, blood in stool, constipation, diarrhea and vomiting.  Endocrine: Negative for cold intolerance, heat intolerance and polydipsia.  Genitourinary: Negative for decreased urine volume, dysuria and hematuria.  Musculoskeletal: Negative for arthralgias, back pain and gait problem.  Skin: Negative for rash.  Allergic/Immunologic: Negative for environmental allergies.  Neurological: Negative for seizures, syncope, light-headedness and headaches.  Hematological: Negative for adenopathy.  Psychiatric/Behavioral: Negative for agitation, dysphoric mood and suicidal ideas. The patient is not nervous/anxious.     Per HPI unless specifically indicated above     Objective:    BP 118/74 (BP Location: Left Arm, Patient Position: Sitting, Cuff Size: Normal)   Pulse 89   Temp 97.7 F (36.5 C) (Other (Comment))   Ht 5\' 5"  (1.651 m)   Wt 172 lb (78 kg)   SpO2 92%  BMI 28.62 kg/m   Wt Readings from Last 3 Encounters:  09/30/16 172 lb (78 kg)  09/02/16 172 lb (78 kg)  08/06/16 174 lb 8 oz (79.2 kg)    Physical Exam  Constitutional: He is oriented to person, place, and time. He appears well-developed and well-nourished.  HENT:  Head: Normocephalic and atraumatic.  Neck: Neck supple.  Cardiovascular: Normal rate and regular rhythm.   Pulmonary/Chest: Effort normal. No respiratory distress.  He has wheezes. He has no rales.  Abdominal: Soft. Bowel sounds are normal. There is no hepatosplenomegaly. There is no tenderness.  Musculoskeletal: He exhibits no edema.  Lymphadenopathy:    He has no cervical adenopathy.  Neurological: He is alert and oriented to person, place, and time.  Skin: Skin is warm and dry.  Psychiatric: He has a normal mood and affect. His behavior is normal.  Vitals reviewed.       Assessment & Plan:   Encounter Diagnoses  Name Primary?  . Chronic obstructive pulmonary disease, unspecified COPD type ([place]) Yes  . Essential hypertension   . Thrombocytopenia ([place])   . Anemia, unspecified type      -pt urged to get cone discount application turned in asap so he can get to pulmonology -pt counseled to avoid running out of his nebulizer medication -no changes today -follow up one month. RTO sooner prn

## 2016-10-28 ENCOUNTER — Encounter: Payer: Self-pay | Admitting: Physician Assistant

## 2016-10-28 ENCOUNTER — Ambulatory Visit: Payer: Self-pay | Admitting: Physician Assistant

## 2016-10-28 VITALS — BP 124/64 | HR 90 | Temp 97.9°F | Ht 65.0 in | Wt 173.2 lb

## 2016-10-28 DIAGNOSIS — D696 Thrombocytopenia, unspecified: Secondary | ICD-10-CM

## 2016-10-28 DIAGNOSIS — J449 Chronic obstructive pulmonary disease, unspecified: Secondary | ICD-10-CM

## 2016-10-28 DIAGNOSIS — D649 Anemia, unspecified: Secondary | ICD-10-CM

## 2016-10-28 DIAGNOSIS — I10 Essential (primary) hypertension: Secondary | ICD-10-CM

## 2016-10-28 DIAGNOSIS — Z9981 Dependence on supplemental oxygen: Secondary | ICD-10-CM

## 2016-10-28 NOTE — Progress Notes (Signed)
BP 124/64 (BP Location: Left Arm, Patient Position: Sitting, Cuff Size: Normal)   Pulse 90   Temp 97.9 F (36.6 C)   Ht 5\' 5"  (1.651 m)   Wt 173 lb 4 oz (78.6 kg)   SpO2 (!) 88%   BMI 28.83 kg/m    Subjective:    Patient ID: Marvin Davis, male    DOB: [birth year], 61 y.o.   MRN: [id number]  HPI: Marvin Davis is a 61 y.o. male presenting on 10/28/2016 for COPD   HPI   Pt says he still doesn't have insurance but is still hoping to get it.   Pt didn't turn in his cone discount application.    Pt didn't bring O2 in with him today.  He wears it all the time at home.   Relevant past medical, surgical, family and social history reviewed and updated as indicated. Interim medical history since our last visit reviewed. Allergies and medications reviewed and updated.   Current Outpatient Prescriptions:  .  albuterol (PROVENTIL HFA;VENTOLIN HFA) 108 (90 Base) MCG/ACT inhaler, Inhale 2 puffs into the lungs every 6 (six) hours as needed for wheezing or shortness of breath., Disp: 1 Inhaler, Rfl: 2 .  albuterol (PROVENTIL) (2.5 MG/3ML) 0.083% nebulizer solution, Take 3 mLs (2.5 mg total) by nebulization every 4 (four) hours as needed for wheezing or shortness of breath., Disp: 450 mL, Rfl: 0 .  amLODipine (NORVASC) 10 MG tablet, Take 1 tablet (10 mg total) by mouth daily., Disp: 90 tablet, Rfl: 1 .  benzonatate (TESSALON) 100 MG capsule, 1- 2  PO q 8 hour prn cough, Disp: 20 capsule, Rfl: 2 .  mometasone-formoterol (DULERA) 200-5 MCG/ACT AERO, Inhale 2 puffs into the lungs 2 (two) times daily., Disp: 3 Inhaler, Rfl: 3 .  OXYGEN, Inhale 2 L into the lungs daily., Disp: , Rfl:    Review of Systems  Constitutional: Negative for appetite change, chills, diaphoresis, fatigue, fever and unexpected weight change.  HENT: Negative for congestion, dental problem, drooling, ear pain, facial swelling, hearing loss, mouth sores, sneezing, sore throat, trouble swallowing and voice change.    Eyes: Negative for pain, discharge, redness, itching and visual disturbance.  Respiratory: Negative for cough, choking, shortness of breath and wheezing.   Cardiovascular: Negative for chest pain, palpitations and leg swelling.  Gastrointestinal: Negative for abdominal pain, blood in stool, constipation, diarrhea and vomiting.  Endocrine: Negative for cold intolerance, heat intolerance and polydipsia.  Genitourinary: Negative for decreased urine volume, dysuria and hematuria.  Musculoskeletal: Negative for arthralgias, back pain and gait problem.  Skin: Negative for rash.  Allergic/Immunologic: Negative for environmental allergies.  Neurological: Negative for seizures, syncope, light-headedness and headaches.  Hematological: Negative for adenopathy.  Psychiatric/Behavioral: Negative for agitation, dysphoric mood and suicidal ideas. The patient is not nervous/anxious.     Per HPI unless specifically indicated above     Objective:    BP 124/64 (BP Location: Left Arm, Patient Position: Sitting, Cuff Size: Normal)   Pulse 90   Temp 97.9 F (36.6 C)   Ht 5\' 5"  (1.651 m)   Wt 173 lb 4 oz (78.6 kg)   SpO2 (!) 88%   BMI 28.83 kg/m   Wt Readings from Last 3 Encounters:  10/28/16 173 lb 4 oz (78.6 kg)  09/30/16 172 lb (78 kg)  09/02/16 172 lb (78 kg)    Physical Exam  Constitutional: He is oriented to person, place, and time. He appears well-developed and well-nourished.  HENT:  Head: Normocephalic  and atraumatic.  Neck: Neck supple.  Cardiovascular: Normal rate and regular rhythm.   Pulmonary/Chest: Effort normal and breath sounds normal. He has no wheezes.  Abdominal: Soft. Bowel sounds are normal. There is no hepatosplenomegaly. There is no tenderness.  Musculoskeletal: He exhibits no edema.  Lymphadenopathy:    He has no cervical adenopathy.  Neurological: He is alert and oriented to person, place, and time.  Skin: Skin is warm and dry.  Psychiatric: He has a normal mood and  affect. His behavior is normal.  Vitals reviewed.       Assessment & Plan:   Encounter Diagnoses  Name Primary?  . Chronic obstructive pulmonary disease, unspecified COPD type ([place]) Yes  . Essential hypertension   . Anemia, unspecified type   . Thrombocytopenia ([place])   . Dependence on supplemental oxygen      -long discussion with pt about need for him to see the pulmonologist.  He needs to Turn in cone disc application so he can get in with the specialist.  It will be great for him in the future if he gets insurance, but he can't wait until some unknown time in the future.  He states understanding.  He still has the application, in fact has it with him today at his appt. -Recheck cbc.  -pt to follow up in 3 months. RTO sooner prn

## 2016-11-04 ENCOUNTER — Other Ambulatory Visit (HOSPITAL_COMMUNITY)
Admission: RE | Admit: 2016-11-04 | Discharge: 2016-11-04 | Disposition: A | Discharge status: Home / Self Care | Payer: Self-pay | Source: Ambulatory Visit | Attending: Physician Assistant | Admitting: Physician Assistant

## 2016-11-04 LAB — CBC
HEMATOCRIT: 37.6 % — AB (ref 39.0–52.0)
Hemoglobin: 12.4 g/dL — ABNORMAL LOW (ref 13.0–17.0)
MCH: 27.1 pg (ref 26.0–34.0)
MCHC: 33 g/dL (ref 30.0–36.0)
MCV: 82.3 fL (ref 78.0–100.0)
Platelets: 112 10*3/uL — ABNORMAL LOW (ref 150–400)
RBC: 4.57 MIL/uL (ref 4.22–5.81)
RDW: 15.7 % — ABNORMAL HIGH (ref 11.5–15.5)
WBC: 3.9 10*3/uL — ABNORMAL LOW (ref 4.0–10.5)

## 2016-12-02 ENCOUNTER — Other Ambulatory Visit: Payer: Self-pay | Admitting: Physician Assistant

## 2016-12-03 ENCOUNTER — Other Ambulatory Visit: Payer: Self-pay | Admitting: Physician Assistant

## 2016-12-31 ENCOUNTER — Institutional Professional Consult (permissible substitution): Payer: Self-pay | Admitting: Internal Medicine

## 2017-01-01 ENCOUNTER — Ambulatory Visit (INDEPENDENT_AMBULATORY_CARE_PROVIDER_SITE_OTHER): Payer: Self-pay | Admitting: Internal Medicine

## 2017-01-01 ENCOUNTER — Encounter: Payer: Self-pay | Admitting: Internal Medicine

## 2017-01-01 VITALS — BP 136/86 | HR 89 | Ht 65.0 in | Wt 171.0 lb

## 2017-01-01 DIAGNOSIS — J449 Chronic obstructive pulmonary disease, unspecified: Secondary | ICD-10-CM

## 2017-01-01 MED ORDER — MOMETASONE FURO-FORMOTEROL FUM 200-5 MCG/ACT IN AERO: 2.0000 | INHALATION_SPRAY | Freq: Two times a day (BID) | RESPIRATORY_TRACT | 0 refills | Status: DC

## 2017-01-01 NOTE — Patient Instructions (Addendum)
Plan A = Automatic = Dulera 200 Take 2 puffs first thing in am and then another 2 puffs about 12 hours later.    Work on inhaler technique:  relax and gently blow all the way out then take a nice smooth deep breath back in, triggering the inhaler at same time you start breathing in.  Hold for up to 5 seconds if you can. Blow out thru nose. Rinse and gargle with water when done     Plan B = Backup Only use your albuterol as a rescue medication to be used if you can't catch your breath by resting or doing a relaxed purse lip breathing pattern.  - The less you use it, the better it will work when you need it. - Ok to use the inhaler up to 2 puffs  every 4 hours if you must but call for appointment if use goes up over your usual need - Don't leave home without it !!  (think of it like the spare tire for your car)   Plan C = Crisis - only use your albuterol nebulizer if you first try Plan B and it fails to help > ok to use the nebulizer up to every 4 hours but if start needing it regularly call for immediate appointment     Please schedule a follow up office visit in 6 weeks, call sooner if needed with all medications /inhalers/ solutions in hand so we can verify exactly what you are taking. This includes all medications from all doctors and over the counters

## 2017-01-01 NOTE — Assessment & Plan Note (Signed)
-   Quit smoking 05/2016 - Spirometry 01/01/2017  FEV1 0.66 (26%)  Ratio 42  p poor saba technique < 4 h prior - 01/01/2017  After extensive coaching HFA effectiveness =    75% vs 25% prior (short Ti limiting)   DDX of  difficult airways management almost all start with A and  include Adherence, Ace Inhibitors, Acid Reflux, Active Sinus Disease, Alpha 1 Antitripsin deficiency, Anxiety masquerading as Airways dz,  ABPA,  Allergy(esp in young), Aspiration (esp in elderly), Adverse effects of meds,  Active smokers, A bunch of PE's (a small clot burden can't cause this syndrome unless there is already severe underlying pulm or vascular dz with poor reserve) plus two Bs  = Bronchiectasis and Beta blocker use..and one C= CHF   In this case Adherence is the biggest issue and starts with  inability to use HFA effectively and also  understand that SABA treats the symptoms but doesn't get to the underlying problem (inflammation).  I used  the analogy of putting steroid cream on a rash to help explain the meaning of topical therapy and the need to get the drug to the target tissue.   - see hfa teaching - return with all meds in hand using a trust but verify approach to confirm accurate Medication  Reconciliation The principal here is that until we are certain that the  patients are doing what we've asked, it makes no sense to ask them to do more.   ? Allergy/ asthma  Component >  High dose dulera adequate for now if learns how to use it effectively/ consistently    ? Active smoking > denies/ reinforced   ? Bronchiectasis >  Really no excess mucus in am's/ nothing to suggest on cxr   ? CHF > echo ok 05/2016 x for mild diastolic dysfunction > rx hbp adequate   Total time devoted to counseling  > 50 % of initial 60 min office visit:  review case with pt/ discussion of options/alternatives/ personally creating written customized instructions  in presence of pt  then going over those specific  Instructions directly  with the pt including how to use all of the meds but in particular covering each new medication in detail and the difference between the maintenance= "automatic" meds and the prns using an action plan format for the latter (If this problem/symptom => do that organization reading Left to right).  Please see AVS from this visit for a full list of these instructions which I personally wrote for this pt and  are unique to this visit.

## 2017-01-01 NOTE — Progress Notes (Signed)
Subjective:     Patient ID: Marvin Davis, male   DOB: 12/08/1955,    MRN: [id number]  HPI  43 yobm quit smoking 05/2016 with doe x 2016 gradually worse since then leading up to admit:  Admit date: 06/17/2016  Discharge date: 06/21/2016       Chief Complaint  Patient presents with  . Shortness of Breath     Brief history of present illness from the day of admission and additional interim summary    61 year old male with no known past medical history mainly due to lack of access to medical care due to lack of insurance comes in [facility] today for acute hypoxic respiratory failure.Patient with worsening shortness of breath and wheezing for about a month. He was found to have oxygen saturation of 83% in room air and elevated blood pressure. Admitted for further evaluation. Patient is active smoker.                                                     Hospital issues addressed    # Acute hypoxic respiratory failure due to COPD exacerbation:  He is an active smoker with no previous diagnosis of COPD, treated with IV sodium Medrol, empiric azithromycin along with mobilizer treatments and oxygen, he was slow to recover but now feels much better, he is moving moderate amounts of air bilaterally and still has mild wheezing. Will be placed on a steroid taper, counseled to quit smoking, we'll get Spiriva along with albuterol nebulizer treatments. Qualified for Home 02. Case management requested to assist the patient with home medications and finding a PCP. Also recommend one-time outpatient pulmonary follow-up.   #Mild Acute onchronic diastolic heart failure EF 60%: Resolved after a dose of Lasix, had minimal fluid overload, requested to stay on low-salt diet, fluid restriction instructions provided. PCP please monitor fluid status. May require low-dose diuretic if stays in fluid overload in the future.   #Hypertension: Based on Norvasc.  #Actively smoker/tobacco  dependence: Counseled to quit.  # Mild Thrombocytopenia. No sign of bleeding. PCP to check CBC in 1-2 weeks outpatient.     Discharge diagnosis     Principal Problem:   Acute respiratory failure with hypoxia (HCC)   COPD suggested by initial evaluation ([place])   Edema of both legs   Dyspnea   Hypertension   Bronchospasm, acute    01/01/2017 1st [facility] office visit/ [facility]   Chief Complaint  Patient presents with  . Pulmonary Consult    Referred by Dr. Soyla Dryer. Pt c/o SOB for the past 3 years. He also has had some wheezing. His breathing has been getting progressively worse over the year. He is SOB when he moves too fast or when he walks 500 yards.    doe -= MMRC2 = can't walk a nl pace on a flat grade s sob but does fine slow and flat no change since admit 05/2016  Sleeps ok  Used dulera and saba hfa/ not neb am ov - easily confused between maint and prns  No obvious day to day or daytime variability or assoc excess/ purulent sputum or mucus plugs or hemoptysis or cp or chest tightness, subjective wheeze or overt sinus or hb symptoms. No unusual exp hx or h/o childhood pna/ asthma or knowledge of premature birth.  Sleeping ok  without nocturnal  or early am exacerbation  of respiratory  c/o's or need for noct saba. Also denies any obvious fluctuation of symptoms with weather or environmental changes or other aggravating or alleviating factors except as outlined above   Current Medications, Allergies, Complete Past Medical History, Past Surgical History, Family History, and Social History were reviewed in [employer] record.  ROS  The following are not active complaints unless bolded sore throat, dysphagia, dental problems, itching, sneezing,  nasal congestion or excess/ purulent secretions, ear ache,   fever, chills, sweats, unintended wt loss, classically pleuritic or exertional cp,  orthopnea pnd or leg swelling, presyncope, palpitations,  abdominal pain, anorexia, nausea, vomiting, diarrhea  or change in bowel or bladder habits, change in stools or urine, dysuria,hematuria,  rash, arthralgias, visual complaints, headache, numbness, weakness or ataxia or problems with walking or coordination,  change in mood/affect or memory.        .   Review of Systems     Objective:   Physical Exam     Wt Readings from Last 3 Encounters:  01/01/17 171 lb (77.6 kg)  10/28/16 173 lb 4 oz (78.6 kg)  09/30/16 172 lb (78 kg)    Vital signs reviewed  - Note on arrival 02 sats  91% on RA     HEENT: nl dentition, turbinates bilaterally, and oropharynx. Nl external ear canals without cough reflex   NECK :  without JVD/Nodes/TM/ nl carotid upstrokes bilaterally   LUNGS: no acc muscle use,   slt barrel chest/ distant bs with pseudo wheeze only    CV:  RRR  no s3 or murmur or increase in P2, and no edema   ABD:  soft and nontender with  Pos hoover's early on insp in the supine position. No bruits or organomegaly appreciated, bowel sounds nl  MS:  Nl gait/ ext warm without deformities, calf tenderness, cyanosis or clubbing No obvious joint restrictions   SKIN: warm and dry without lesions    NEURO:  alert, approp, nl sensorium with  no motor or cerebellar deficits apparent.     I personally reviewed images and agree with radiology impression as follows:  pCXR:   06/21/16 No acute cardiopulmonary disease.   Assessment:

## 2017-01-28 ENCOUNTER — Ambulatory Visit: Payer: Self-pay | Admitting: Physician Assistant

## 2017-01-28 ENCOUNTER — Other Ambulatory Visit (HOSPITAL_COMMUNITY)
Admission: RE | Admit: 2017-01-28 | Discharge: 2017-01-28 | Disposition: A | Discharge status: Home / Self Care | Payer: Self-pay | Source: Ambulatory Visit | Attending: Physician Assistant | Admitting: Physician Assistant

## 2017-01-28 ENCOUNTER — Encounter: Payer: Self-pay | Admitting: Physician Assistant

## 2017-01-28 VITALS — BP 120/70 | HR 76 | Temp 97.5°F | Ht 65.0 in | Wt 173.8 lb

## 2017-01-28 DIAGNOSIS — J449 Chronic obstructive pulmonary disease, unspecified: Secondary | ICD-10-CM

## 2017-01-28 DIAGNOSIS — D696 Thrombocytopenia, unspecified: Secondary | ICD-10-CM

## 2017-01-28 DIAGNOSIS — D649 Anemia, unspecified: Secondary | ICD-10-CM | POA: Insufficient documentation

## 2017-01-28 DIAGNOSIS — Z550 Illiteracy and low-level literacy: Secondary | ICD-10-CM | POA: Insufficient documentation

## 2017-01-28 DIAGNOSIS — I10 Essential (primary) hypertension: Secondary | ICD-10-CM

## 2017-01-28 LAB — CBC WITH DIFFERENTIAL/PLATELET
Basophils Absolute: 0 10*3/uL (ref 0.0–0.1)
Basophils Relative: 0 %
Eosinophils Absolute: 0.1 10*3/uL (ref 0.0–0.7)
Eosinophils Relative: 3 %
HCT: 36.4 % — ABNORMAL LOW (ref 39.0–52.0)
HEMOGLOBIN: 12.1 g/dL — AB (ref 13.0–17.0)
LYMPHS ABS: 1.2 10*3/uL (ref 0.7–4.0)
Lymphocytes Relative: 26 %
MCH: 26.3 pg (ref 26.0–34.0)
MCHC: 33.2 g/dL (ref 30.0–36.0)
MCV: 79.1 fL (ref 78.0–100.0)
Monocytes Absolute: 0.2 10*3/uL (ref 0.1–1.0)
Monocytes Relative: 4 %
NEUTROS PCT: 67 %
Neutro Abs: 3.1 10*3/uL (ref 1.7–7.7)
Platelets: 167 10*3/uL (ref 150–400)
RBC: 4.6 MIL/uL (ref 4.22–5.81)
RDW: 17.2 % — ABNORMAL HIGH (ref 11.5–15.5)
WBC: 4.6 10*3/uL (ref 4.0–10.5)

## 2017-01-28 LAB — PROTIME-INR
INR: 1.03
Prothrombin Time: 13.4 seconds (ref 11.4–15.2)

## 2017-01-28 NOTE — Patient Instructions (Signed)
Financial Counselor- [phone]  

## 2017-01-28 NOTE — Progress Notes (Signed)
BP 120/70 (BP Location: Right Arm, Patient Position: Sitting, Cuff Size: Normal)   Pulse 76   Temp (!) 97.5 F (36.4 C)   Ht 5\' 5"  (1.651 m)   Wt 173 lb 12 oz (78.8 kg)   SpO2 92%   BMI 28.91 kg/m    Subjective:    Patient ID: Marvin Davis, male    DOB: 27-Jun-1955, 61 y.o.   MRN: [id number]  HPI: Marvin Davis is a 61 y.o. male presenting on 01/28/2017 for COPD; Hypertension; and Anemia   HPI   Pt says he didn't turn in his [facility] discount application.  He says he doesn't have insurance.     Pt saw dr Melvyn Novas  Pt concerned about bruising.   He has a large bruise on his LUE and didn't have injury.   Pt denies blood PR.   Relevant past medical, surgical, family and social history reviewed and updated as indicated. Interim medical history since our last visit reviewed. Allergies and medications reviewed and updated.   Current Outpatient Prescriptions:  .  albuterol (PROVENTIL) (2.5 MG/3ML) 0.083% nebulizer solution, INHALE 1 VIAL VIA NEBULIZER EVERY 4 HOURS AS NEEDED FOR WHEEZING OR SHORTNESS OF BREATH, Disp: 450 mL, Rfl: 2 .  amLODipine (NORVASC) 10 MG tablet, Take 1 tablet (10 mg total) by mouth daily., Disp: 90 tablet, Rfl: 1 .  mometasone-formoterol (DULERA) 200-5 MCG/ACT AERO, Inhale 2 puffs into the lungs 2 (two) times daily., Disp: 1 Inhaler, Rfl: 0 .  OXYGEN, 2 lpm with sleep ? DME, Disp: , Rfl:  .  PROVENTIL HFA 108 (90 Base) MCG/ACT inhaler, INHALE 2 PUFFS BY MOUTH EVERY 6 HOURS AS NEEDED FOR COUGHING, WHEEZING, OR SHORTNESS OF BREATH, Disp: 3 Inhaler, Rfl: 2   Review of Systems  Respiratory: Positive for shortness of breath.   Cardiovascular: Negative for chest pain.  Gastrointestinal: Negative for abdominal pain and blood in stool.    Per HPI unless specifically indicated above     Objective:    BP 120/70 (BP Location: Right Arm, Patient Position: Sitting, Cuff Size: Normal)   Pulse 76   Temp (!) 97.5 F (36.4 C)   Ht 5\' 5"  (1.651 m)   Wt 173  lb 12 oz (78.8 kg)   SpO2 92%   BMI 28.91 kg/m   Wt Readings from Last 3 Encounters:  01/28/17 173 lb 12 oz (78.8 kg)  01/01/17 171 lb (77.6 kg)  10/28/16 173 lb 4 oz (78.6 kg)    Physical Exam  Constitutional: He is oriented to person, place, and time. He appears well-developed and well-nourished.  HENT:  Head: Normocephalic and atraumatic.  Neck: Neck supple.  Cardiovascular: Normal rate and regular rhythm.   Pulmonary/Chest: Effort normal and breath sounds normal. He has no wheezes.  Abdominal: Soft. Bowel sounds are normal. There is no hepatosplenomegaly. There is no tenderness.  Musculoskeletal: He exhibits no edema.  Lymphadenopathy:    He has no cervical adenopathy.  Neurological: He is alert and oriented to person, place, and time.  Skin: Skin is warm and dry. Bruising noted.  Bruise L upper arm 2-3 inches in diameter.    Psychiatric: He has a normal mood and affect. His behavior is normal.  Vitals reviewed.       Assessment & Plan:   Encounter Diagnoses  Name Primary?  . Chronic obstructive pulmonary disease, unspecified COPD type ([place]) Yes  . Essential hypertension   . Anemia, unspecified type   . Thrombocytopenia ([place])   .  Unable to read or write     -will Check cbc, PT.  Discussed with pt that he May need referral to hematology -Urged pt to turn in [facility] discount application -pt to follow up 2 months.  RTO sooner prn worsening bruising or bleeding

## 2017-02-12 ENCOUNTER — Ambulatory Visit: Payer: Self-pay | Admitting: Internal Medicine

## 2017-03-31 ENCOUNTER — Ambulatory Visit: Payer: Self-pay | Admitting: Physician Assistant

## 2017-03-31 ENCOUNTER — Encounter: Payer: Self-pay | Admitting: Physician Assistant

## 2017-03-31 VITALS — BP 136/80 | HR 82 | Temp 97.5°F | Ht 65.0 in | Wt 185.0 lb

## 2017-03-31 DIAGNOSIS — J449 Chronic obstructive pulmonary disease, unspecified: Secondary | ICD-10-CM

## 2017-03-31 DIAGNOSIS — Z9981 Dependence on supplemental oxygen: Secondary | ICD-10-CM

## 2017-03-31 DIAGNOSIS — Z550 Illiteracy and low-level literacy: Secondary | ICD-10-CM

## 2017-03-31 DIAGNOSIS — D649 Anemia, unspecified: Secondary | ICD-10-CM

## 2017-03-31 DIAGNOSIS — I10 Essential (primary) hypertension: Secondary | ICD-10-CM

## 2017-03-31 DIAGNOSIS — D696 Thrombocytopenia, unspecified: Secondary | ICD-10-CM

## 2017-03-31 MED ORDER — AMLODIPINE BESYLATE 10 MG PO TABS: 10.0000 mg | ORAL_TABLET | Freq: Every day | ORAL | 1 refills | Status: DC

## 2017-03-31 NOTE — Progress Notes (Signed)
BP 136/80 (BP Location: Left Arm, Patient Position: Sitting, Cuff Size: Normal)   Pulse 82   Temp (!) 97.5 F (36.4 C)   Ht 5\' 5"  (1.651 m)   Wt 185 lb (83.9 kg)   SpO2 95%   BMI 30.79 kg/m    Subjective:    Patient ID: Marvin Davis, male    DOB: [birth year], 61 y.o.   MRN: [id number]  HPI: Marvin Davis is a 61 y.o. male presenting on 03/31/2017 for Follow-up   HPI  Pt still didn't turn in his [facility] discount application (to help with his pulmonology appointment)  Pt ran out of his amlodipine 2 wk ago  Pt was supposed to Follow-Up with dr Melvyn Novas but did not- pt had no-show there on 02/12/17  Pt says his breathing is okay- at his baseline.  Pt is wearing his oxygen today.   Pt is having no problems with bruising or bleeding.   Relevant past medical, surgical, family and social history reviewed and updated as indicated. Interim medical history since our last visit reviewed. Allergies and medications reviewed and updated.   Current Outpatient Medications:  .  albuterol (PROVENTIL) (2.5 MG/3ML) 0.083% nebulizer solution, INHALE 1 VIAL VIA NEBULIZER EVERY 4 HOURS AS NEEDED FOR WHEEZING OR SHORTNESS OF BREATH, Disp: 450 mL, Rfl: 2 .  mometasone-formoterol (DULERA) 200-5 MCG/ACT AERO, Inhale 2 puffs into the lungs 2 (two) times daily., Disp: 1 Inhaler, Rfl: 0 .  OXYGEN, 2 lpm with sleep ? DME, Disp: , Rfl:  .  PROVENTIL HFA 108 (90 Base) MCG/ACT inhaler, INHALE 2 PUFFS BY MOUTH EVERY 6 HOURS AS NEEDED FOR COUGHING, WHEEZING, OR SHORTNESS OF BREATH, Disp: 3 Inhaler, Rfl: 2 .  amLODipine (NORVASC) 10 MG tablet, Take 1 tablet (10 mg total) by mouth daily. (Patient not taking: Reported on 03/31/2017), Disp: 90 tablet, Rfl: 1   Review of Systems  Constitutional: Negative for appetite change, chills, diaphoresis, fatigue, fever and unexpected weight change.  HENT: Negative for congestion, dental problem, drooling, ear pain, facial swelling, hearing loss, mouth sores,  sneezing, sore throat, trouble swallowing and voice change.   Eyes: Negative for pain, discharge, redness, itching and visual disturbance.  Respiratory: Negative for cough, choking, shortness of breath and wheezing.   Cardiovascular: Negative for chest pain, palpitations and leg swelling.  Gastrointestinal: Negative for abdominal pain, blood in stool, constipation, diarrhea and vomiting.  Endocrine: Negative for cold intolerance, heat intolerance and polydipsia.  Genitourinary: Negative for decreased urine volume, dysuria and hematuria.  Musculoskeletal: Negative for arthralgias, back pain and gait problem.  Skin: Negative for rash.  Allergic/Immunologic: Negative for environmental allergies.  Neurological: Negative for seizures, syncope, light-headedness and headaches.  Hematological: Negative for adenopathy.  Psychiatric/Behavioral: Negative for agitation, dysphoric mood and suicidal ideas. The patient is not nervous/anxious.     Per HPI unless specifically indicated above     Objective:    BP 136/80 (BP Location: Left Arm, Patient Position: Sitting, Cuff Size: Normal)   Pulse 82   Temp (!) 97.5 F (36.4 C)   Ht 5\' 5"  (1.651 m)   Wt 185 lb (83.9 kg)   SpO2 95%   BMI 30.79 kg/m   Wt Readings from Last 3 Encounters:  03/31/17 185 lb (83.9 kg)  01/28/17 173 lb 12 oz (78.8 kg)  01/01/17 171 lb (77.6 kg)    Physical Exam  Constitutional: He is oriented to person, place, and time. He appears well-developed and well-nourished.  HENT:  Head: Normocephalic and  atraumatic.  Neck: Neck supple.  Cardiovascular: Normal rate and regular rhythm.  Pulmonary/Chest: Effort normal and breath sounds normal. He has no wheezes.  Abdominal: Soft. Bowel sounds are normal. There is no hepatosplenomegaly. There is no tenderness.  Musculoskeletal: He exhibits no edema.  Lymphadenopathy:    He has no cervical adenopathy.  Neurological: He is alert and oriented to person, place, and time.  Skin:  Skin is warm and dry.  Psychiatric: He has a normal mood and affect. His behavior is normal.  Vitals reviewed.        Assessment & Plan:    Encounter Diagnoses  Name Primary?  . Essential hypertension Yes  . Chronic obstructive pulmonary disease, unspecified COPD type ([place])   . Anemia, unspecified type   . Thrombocytopenia ([place])   . Unable to read or write   . Dependence on supplemental oxygen     -amldipine sent to local [facility]- until he can get it from [facility] -recommended pt turn in [facility] discount application -discussed with pt that dr Gustavus Bryant last office note says that pt was to follow up.  Recommended he contact office to reschedule the appointment he missed -check cbc and bmp today -pt to continue current medications -pt to follow up 3 months.  RTO sooner prn

## 2017-04-02 ENCOUNTER — Other Ambulatory Visit: Payer: Self-pay | Admitting: Physician Assistant

## 2017-04-02 ENCOUNTER — Other Ambulatory Visit (HOSPITAL_COMMUNITY)
Admission: RE | Admit: 2017-04-02 | Discharge: 2017-04-02 | Disposition: A | Discharge status: Home / Self Care | Payer: Self-pay | Source: Ambulatory Visit | Attending: Physician Assistant | Admitting: Physician Assistant

## 2017-04-02 DIAGNOSIS — I10 Essential (primary) hypertension: Secondary | ICD-10-CM | POA: Insufficient documentation

## 2017-04-02 DIAGNOSIS — D649 Anemia, unspecified: Secondary | ICD-10-CM | POA: Insufficient documentation

## 2017-04-02 DIAGNOSIS — D696 Thrombocytopenia, unspecified: Secondary | ICD-10-CM | POA: Insufficient documentation

## 2017-04-02 LAB — BASIC METABOLIC PANEL
ANION GAP: 8 (ref 5–15)
BUN: 16 mg/dL (ref 6–20)
CHLORIDE: 102 mmol/L (ref 101–111)
CO2: 29 mmol/L (ref 22–32)
Calcium: 9.1 mg/dL (ref 8.9–10.3)
Creatinine, Ser: 1.05 mg/dL (ref 0.61–1.24)
Glucose, Bld: 98 mg/dL (ref 65–99)
POTASSIUM: 3.9 mmol/L (ref 3.5–5.1)
SODIUM: 139 mmol/L (ref 135–145)

## 2017-04-02 LAB — CBC
HEMATOCRIT: 33.7 % — AB (ref 39.0–52.0)
HEMOGLOBIN: 11 g/dL — AB (ref 13.0–17.0)
MCH: 25.4 pg — ABNORMAL LOW (ref 26.0–34.0)
MCHC: 32.6 g/dL (ref 30.0–36.0)
MCV: 77.8 fL — AB (ref 78.0–100.0)
Platelets: 159 10*3/uL (ref 150–400)
RBC: 4.33 MIL/uL (ref 4.22–5.81)
RDW: 18.5 % — ABNORMAL HIGH (ref 11.5–15.5)
WBC: 4.5 10*3/uL (ref 4.0–10.5)

## 2017-06-02 ENCOUNTER — Other Ambulatory Visit: Payer: Self-pay | Admitting: Physician Assistant

## 2017-06-23 ENCOUNTER — Other Ambulatory Visit (HOSPITAL_COMMUNITY)
Admission: RE | Admit: 2017-06-23 | Discharge: 2017-06-23 | Disposition: A | Discharge status: Home / Self Care | Payer: Self-pay | Source: Ambulatory Visit | Attending: Physician Assistant | Admitting: Physician Assistant

## 2017-06-23 DIAGNOSIS — D649 Anemia, unspecified: Secondary | ICD-10-CM | POA: Insufficient documentation

## 2017-06-23 LAB — CBC
HCT: 31.8 % — ABNORMAL LOW (ref 39.0–52.0)
Hemoglobin: 10 g/dL — ABNORMAL LOW (ref 13.0–17.0)
MCH: 24.1 pg — ABNORMAL LOW (ref 26.0–34.0)
MCHC: 31.4 g/dL (ref 30.0–36.0)
MCV: 76.6 fL — ABNORMAL LOW (ref 78.0–100.0)
Platelets: 167 K/uL (ref 150–400)
RBC: 4.15 MIL/uL — ABNORMAL LOW (ref 4.22–5.81)
RDW: 19.4 % — ABNORMAL HIGH (ref 11.5–15.5)
WBC: 4.4 K/uL (ref 4.0–10.5)

## 2017-07-01 ENCOUNTER — Encounter: Payer: Self-pay | Admitting: Physician Assistant

## 2017-07-01 ENCOUNTER — Ambulatory Visit: Payer: Self-pay | Admitting: Physician Assistant

## 2017-07-01 VITALS — BP 136/70 | HR 93 | Temp 98.1°F | Ht 65.0 in | Wt 191.8 lb

## 2017-07-01 DIAGNOSIS — J449 Chronic obstructive pulmonary disease, unspecified: Secondary | ICD-10-CM

## 2017-07-01 DIAGNOSIS — M7051 Other bursitis of knee, right knee: Secondary | ICD-10-CM

## 2017-07-01 DIAGNOSIS — D649 Anemia, unspecified: Secondary | ICD-10-CM

## 2017-07-01 DIAGNOSIS — Z550 Illiteracy and low-level literacy: Secondary | ICD-10-CM

## 2017-07-01 DIAGNOSIS — Z9981 Dependence on supplemental oxygen: Secondary | ICD-10-CM

## 2017-07-01 DIAGNOSIS — I10 Essential (primary) hypertension: Secondary | ICD-10-CM

## 2017-07-01 MED ORDER — AMLODIPINE BESYLATE 10 MG PO TABS: 10.0000 mg | ORAL_TABLET | Freq: Every day | ORAL | 3 refills | Status: DC

## 2017-07-01 NOTE — Patient Instructions (Addendum)
Start taking over-the-counter iron every day for your anemia.   Call Dr Melvyn Novas (pulmonology doctor) to schedule follow up appointment-   [phone]

## 2017-07-01 NOTE — Progress Notes (Signed)
BP 136/70 (BP Location: Left Arm, Patient Position: Sitting, Cuff Size: Normal)   Pulse 93   Temp 98.1 F (36.7 C)   Ht 5\' 5"  (1.651 m)   Wt 191 lb 12 oz (87 kg)   SpO2 91%   BMI 31.91 kg/m    Subjective:    Patient ID: Marvin Davis, male    DOB: 02-Dec-1955, 62 y.o.   MRN: [id number]  HPI: Marvin Davis is a 62 y.o. male presenting on 07/01/2017 for Hypertension; COPD; and Anemia   HPI   O2 sat today 91% on 2L oxygen supplemental.  He wears his oxygen all the time now.   Pt ran out of his [employer] so he is using his old advair.  He has called [facility] and says more is on the way  Pt is in good spirits today and has no complaints.   Relevant past medical, surgical, family and social history reviewed and updated as indicated. Interim medical history since our last visit reviewed. Allergies and medications reviewed and updated.   Current Outpatient Medications:  .  albuterol (PROVENTIL) (2.5 MG/3ML) 0.083% nebulizer solution, INHALE 1 VIAL VIA NEBULIZER EVERY 4 HOURS AS NEEDED FOR WHEEZING OR SHORTNESS OF BREATH, Disp: 450 mL, Rfl: 2 .  amLODipine (NORVASC) 5 MG tablet, TAKE 1 TABLET BY MOUTH DAILY, Disp: 30 tablet, Rfl: 1 .  Fluticasone-Salmeterol (ADVAIR) 250-50 MCG/DOSE AEPB, Inhale 1 puff into the lungs 2 (two) times daily., Disp: , Rfl:  .  OXYGEN, 2 lpm with sleep ? DME, Disp: , Rfl:  .  PROVENTIL HFA 108 (90 Base) MCG/ACT inhaler, INHALE 2 PUFFS BY MOUTH EVERY 6 HOURS AS NEEDED FOR COUGHING, WHEEZING, OR SHORTNESS OF BREATH, Disp: 3 Inhaler, Rfl: 2 .  amLODipine (NORVASC) 10 MG tablet, Take 1 tablet (10 mg total) daily by mouth. (Patient not taking: Reported on 07/01/2017), Disp: 30 tablet, Rfl: 1 .  mometasone-formoterol (DULERA) 200-5 MCG/ACT AERO, Inhale 2 puffs into the lungs 2 (two) times daily. (Patient not taking: Reported on 07/01/2017), Disp: 1 Inhaler, Rfl: 0   Review of Systems  Constitutional: Negative for appetite change, chills, diaphoresis,  fatigue, fever and unexpected weight change.  HENT: Negative for congestion, dental problem, drooling, ear pain, facial swelling, hearing loss, mouth sores, sneezing, sore throat, trouble swallowing and voice change.   Eyes: Negative for pain, discharge, redness, itching and visual disturbance.  Respiratory: Negative for cough, choking, shortness of breath and wheezing.   Cardiovascular: Negative for chest pain, palpitations and leg swelling.  Gastrointestinal: Negative for abdominal pain, blood in stool, constipation, diarrhea and vomiting.  Endocrine: Negative for cold intolerance, heat intolerance and polydipsia.  Genitourinary: Negative for decreased urine volume, dysuria and hematuria.  Musculoskeletal: Negative for arthralgias, back pain and gait problem.  Skin: Negative for rash.  Allergic/Immunologic: Negative for environmental allergies.  Neurological: Negative for seizures, syncope, light-headedness and headaches.  Hematological: Negative for adenopathy.  Psychiatric/Behavioral: Negative for agitation, dysphoric mood and suicidal ideas. The patient is not nervous/anxious.     Per HPI unless specifically indicated above     Objective:    BP 136/70 (BP Location: Left Arm, Patient Position: Sitting, Cuff Size: Normal)   Pulse 93   Temp 98.1 F (36.7 C)   Ht 5\' 5"  (1.651 m)   Wt 191 lb 12 oz (87 kg)   SpO2 91%   BMI 31.91 kg/m   Wt Readings from Last 3 Encounters:  07/01/17 191 lb 12 oz (87 kg)  03/31/17 185  lb (83.9 kg)  01/28/17 173 lb 12 oz (78.8 kg)    Physical Exam  Constitutional: He is oriented to person, place, and time. He appears well-developed and well-nourished.  HENT:  Head: Normocephalic and atraumatic.  Neck: Neck supple.  Cardiovascular: Normal rate and regular rhythm.  Pulmonary/Chest: Effort normal and breath sounds normal. He has no wheezes.  Abdominal: Soft. Bowel sounds are normal. There is no hepatosplenomegaly. There is no tenderness.   Musculoskeletal: He exhibits no edema.       Right knee: He exhibits swelling. He exhibits normal range of motion, no ecchymosis, no deformity, no erythema, normal alignment and normal patellar mobility. No tenderness found.  + R patellar bursitis.  Nontender. Soft mobile.   Lymphadenopathy:    He has no cervical adenopathy.  Neurological: He is alert and oriented to person, place, and time.  Skin: Skin is warm and dry.  Psychiatric: He has a normal mood and affect. His behavior is normal.  Vitals reviewed.     Results for orders placed or performed during the hospital encounter of 06/23/17  CBC  Result Value Ref Range   WBC 4.4 4.0 - 10.5 K/uL   RBC 4.15 (L) 4.22 - 5.81 MIL/uL   Hemoglobin 10.0 (L) 13.0 - 17.0 g/dL   HCT 31.8 (L) 39.0 - 52.0 %   MCV 76.6 (L) 78.0 - 100.0 fL   MCH 24.1 (L) 26.0 - 34.0 pg   MCHC 31.4 30.0 - 36.0 g/dL   RDW 19.4 (H) 11.5 - 15.5 %   Platelets 167 150 - 400 K/uL      Assessment & Plan:    Encounter Diagnoses  Name Primary?  . Essential hypertension Yes  . Chronic obstructive pulmonary disease, unspecified COPD type ([place])   . Anemia, unspecified type   . Unable to read or write   . Dependence on supplemental oxygen   . Patellar bursitis of right knee     -reviewed labs with pt -Counseled pt on [facility] chartity care application for labwork and also so he can get back to follow up with Dr Melvyn Novas (pulmonologsit).  He is given contact number to schedule follow up appointment -Pt to get back on amlodipine 10mg  -pt to Start iron otc daily -pt counseled to use knee pads or some sort of cushion when working on knees (on the floor) -pt to follow up in 3 months .  RTO sooner prn

## 2017-09-29 ENCOUNTER — Encounter: Payer: Self-pay | Admitting: Physician Assistant

## 2017-09-29 ENCOUNTER — Ambulatory Visit: Payer: Self-pay | Admitting: Physician Assistant

## 2017-09-29 ENCOUNTER — Other Ambulatory Visit: Payer: Self-pay | Admitting: Physician Assistant

## 2017-09-29 VITALS — BP 119/72 | HR 84 | Temp 98.1°F | Ht 65.0 in | Wt 187.8 lb

## 2017-09-29 DIAGNOSIS — Z1211 Encounter for screening for malignant neoplasm of colon: Secondary | ICD-10-CM

## 2017-09-29 DIAGNOSIS — J449 Chronic obstructive pulmonary disease, unspecified: Secondary | ICD-10-CM

## 2017-09-29 DIAGNOSIS — D649 Anemia, unspecified: Secondary | ICD-10-CM

## 2017-09-29 DIAGNOSIS — R109 Unspecified abdominal pain: Secondary | ICD-10-CM

## 2017-09-29 DIAGNOSIS — S20211A Contusion of right front wall of thorax, initial encounter: Secondary | ICD-10-CM

## 2017-09-29 DIAGNOSIS — Z9981 Dependence on supplemental oxygen: Secondary | ICD-10-CM

## 2017-09-29 DIAGNOSIS — I10 Essential (primary) hypertension: Secondary | ICD-10-CM

## 2017-09-29 LAB — POCT URINALYSIS DIPSTICK
Bilirubin, UA: NEGATIVE
Glucose, UA: NEGATIVE
Ketones, UA: NEGATIVE
LEUKOCYTES UA: NEGATIVE
NITRITE UA: NEGATIVE
PH UA: 6 (ref 5.0–8.0)
RBC UA: NEGATIVE
Spec Grav, UA: 1.015 (ref 1.010–1.025)
UROBILINOGEN UA: 0.2 U/dL

## 2017-09-29 NOTE — Progress Notes (Signed)
BP 119/72 (BP Location: Right Arm, Patient Position: Sitting, Cuff Size: Normal)   Pulse 84   Temp 98.1 F (36.7 C)   Ht 5\' 5"  (1.651 m)   Wt 187 lb 12 oz (85.2 kg)   SpO2 91%   BMI 31.24 kg/m    Subjective:    Patient ID: Marvin Davis, male    DOB: 05-Oct-1955, 62 y.o.   MRN: [id number]  HPI: Marvin Davis is a 62 y.o. male presenting on 09/29/2017 for Follow-up   HPI   Pt didn't make appointment with the pulmonologist.  He didn't turn in his [employer] application.  He was encouraged to do those things at his last office visit.   Pt is still taking his 5mg  norvasc because he never went and picked up the 10mg  norvasc.  Pt complains of R flank pain for about a week.  No known injury.  He says sometimes it will just feel like a sharp poke in the area.   No dysuria.  No change in breathing.   Relevant past medical, surgical, family and social history reviewed and updated as indicated. Interim medical history since our last visit reviewed. Allergies and medications reviewed and updated.   Current Outpatient Medications:  .  albuterol (PROVENTIL) (2.5 MG/3ML) 0.083% nebulizer solution, INHALE 1 VIAL VIA NEBULIZER EVERY 4 HOURS AS NEEDED FOR WHEEZING OR SHORTNESS OF BREATH, Disp: 450 mL, Rfl: 2 .  amLODipine (NORVASC) 10 MG tablet, Take 1 tablet (10 mg total) by mouth daily., Disp: 30 tablet, Rfl: 3 .  IRON PO, Take by mouth., Disp: , Rfl:  .  mometasone-formoterol (DULERA) 200-5 MCG/ACT AERO, Inhale 2 puffs into the lungs 2 (two) times daily., Disp: 1 Inhaler, Rfl: 0 .  OXYGEN, 2 lpm with sleep ? DME, Disp: , Rfl:  .  PROVENTIL HFA 108 (90 Base) MCG/ACT inhaler, INHALE 2 PUFFS BY MOUTH EVERY 6 HOURS AS NEEDED FOR COUGHING, WHEEZING, OR SHORTNESS OF BREATH, Disp: 3 Inhaler, Rfl: 2   Review of Systems  Constitutional: Negative for appetite change, chills, diaphoresis, fatigue, fever and unexpected weight change.  HENT: Negative for congestion, dental problem,  drooling, ear pain, facial swelling, hearing loss, mouth sores, sneezing, sore throat, trouble swallowing and voice change.   Eyes: Negative for pain, discharge, redness, itching and visual disturbance.  Respiratory: Negative for cough, choking, shortness of breath and wheezing.   Cardiovascular: Negative for chest pain, palpitations and leg swelling.  Gastrointestinal: Negative for abdominal pain, blood in stool, constipation, diarrhea and vomiting.  Endocrine: Negative for cold intolerance, heat intolerance and polydipsia.  Genitourinary: Negative for decreased urine volume, dysuria and hematuria.  Musculoskeletal: Negative for arthralgias, back pain and gait problem.  Skin: Negative for rash.  Allergic/Immunologic: Negative for environmental allergies.  Neurological: Negative for seizures, syncope, light-headedness and headaches.  Hematological: Negative for adenopathy.  Psychiatric/Behavioral: Negative for agitation, dysphoric mood and suicidal ideas. The patient is not nervous/anxious.     Per HPI unless specifically indicated above     Objective:    BP 119/72 (BP Location: Right Arm, Patient Position: Sitting, Cuff Size: Normal)   Pulse 84   Temp 98.1 F (36.7 C)   Ht 5\' 5"  (1.651 m)   Wt 187 lb 12 oz (85.2 kg)   SpO2 91%   BMI 31.24 kg/m   Wt Readings from Last 3 Encounters:  09/29/17 187 lb 12 oz (85.2 kg)  07/01/17 191 lb 12 oz (87 kg)  03/31/17 185 lb (83.9  kg)    Physical Exam  Constitutional: He is oriented to person, place, and time. He appears well-developed and well-nourished.  HENT:  Head: Normocephalic and atraumatic.  Neck: Neck supple.  Cardiovascular: Normal rate and regular rhythm.  Pulmonary/Chest: Effort normal and breath sounds normal. He has no wheezes.  Abdominal: Soft. Bowel sounds are normal. There is no hepatosplenomegaly. There is no tenderness. There is CVA tenderness.  R CVA tenderness  Musculoskeletal: He exhibits no edema.   Lymphadenopathy:    He has no cervical adenopathy.  Neurological: He is alert and oriented to person, place, and time.  Skin: Skin is warm and dry.  Psychiatric: He has a normal mood and affect. His behavior is normal.  Vitals reviewed.       Assessment & Plan:   Encounter Diagnoses  Name Primary?  . Essential hypertension Yes  . Chronic obstructive pulmonary disease, unspecified COPD type ([place])   . Flank pain   . Anemia, unspecified type   . Dependence on supplemental oxygen   . Contusion of rib on right side, initial encounter   . Special screening for malignant neoplasm of colon     -pt given ifobt for colon cancer screening -discussed with pt likely contused and should heal without difficulty.  He should RTO if he develops sob -pt to get labs when he leaves the office today -pt counseled to turn in his [employer] application -pt encouraged to contact dr Wert/pulmonology for follow up appointment --reminded pt to pick up the 10mg  norvasc -pt to follow up in 3 months.  RTO sooner prn

## 2017-09-29 NOTE — Patient Instructions (Addendum)
-  turn in [employer] application -reschedule follow up appointment with Dr Melvyn Novas (pulmonologist) -pick up 10mg  amlodipine at [facility] -get blood/labs drawn -return stool test/colon cancer screening test

## 2017-09-30 ENCOUNTER — Other Ambulatory Visit: Payer: Self-pay | Admitting: Physician Assistant

## 2017-09-30 MED ORDER — AMLODIPINE BESYLATE 10 MG PO TABS: 10.0000 mg | ORAL_TABLET | Freq: Every day | ORAL | 1 refills | Status: DC

## 2017-10-01 ENCOUNTER — Other Ambulatory Visit (HOSPITAL_COMMUNITY)
Admission: RE | Admit: 2017-10-01 | Discharge: 2017-10-01 | Disposition: A | Discharge status: Home / Self Care | Payer: Self-pay | Source: Ambulatory Visit | Attending: Physician Assistant | Admitting: Physician Assistant

## 2017-10-01 DIAGNOSIS — D649 Anemia, unspecified: Secondary | ICD-10-CM | POA: Insufficient documentation

## 2017-10-01 DIAGNOSIS — I10 Essential (primary) hypertension: Secondary | ICD-10-CM | POA: Insufficient documentation

## 2017-10-01 LAB — CBC
HCT: 32 % — ABNORMAL LOW (ref 39.0–52.0)
Hemoglobin: 10.2 g/dL — ABNORMAL LOW (ref 13.0–17.0)
MCH: 23.8 pg — ABNORMAL LOW (ref 26.0–34.0)
MCHC: 31.9 g/dL (ref 30.0–36.0)
MCV: 74.6 fL — ABNORMAL LOW (ref 78.0–100.0)
Platelets: 151 10*3/uL (ref 150–400)
RBC: 4.29 MIL/uL (ref 4.22–5.81)
RDW: 19.7 % — AB (ref 11.5–15.5)
WBC: 2.7 10*3/uL — ABNORMAL LOW (ref 4.0–10.5)

## 2017-10-01 LAB — BASIC METABOLIC PANEL
Anion gap: 6 (ref 5–15)
BUN: 14 mg/dL (ref 6–20)
CALCIUM: 9.3 mg/dL (ref 8.9–10.3)
CO2: 30 mmol/L (ref 22–32)
CREATININE: 0.97 mg/dL (ref 0.61–1.24)
Chloride: 102 mmol/L (ref 101–111)
Glucose, Bld: 93 mg/dL (ref 65–99)
Potassium: 4 mmol/L (ref 3.5–5.1)
SODIUM: 138 mmol/L (ref 135–145)

## 2017-10-01 LAB — IFOBT (OCCULT BLOOD): IMMUNOLOGICAL FECAL OCCULT BLOOD TEST: NEGATIVE

## 2017-10-06 ENCOUNTER — Ambulatory Visit (INDEPENDENT_AMBULATORY_CARE_PROVIDER_SITE_OTHER): Payer: Self-pay | Admitting: Internal Medicine

## 2017-10-06 ENCOUNTER — Encounter: Payer: Self-pay | Admitting: Internal Medicine

## 2017-10-06 VITALS — BP 134/70 | HR 88 | Ht 65.0 in | Wt 190.0 lb

## 2017-10-06 DIAGNOSIS — J449 Chronic obstructive pulmonary disease, unspecified: Secondary | ICD-10-CM

## 2017-10-06 MED ORDER — GLYCOPYRROLATE-FORMOTEROL 9-4.8 MCG/ACT IN AERO: 2.0000 | INHALATION_SPRAY | Freq: Two times a day (BID) | RESPIRATORY_TRACT | 0 refills | Status: DC

## 2017-10-06 MED ORDER — GLYCOPYRROLATE-FORMOTEROL 9-4.8 MCG/ACT IN AERO: 2.0000 | INHALATION_SPRAY | Freq: Two times a day (BID) | RESPIRATORY_TRACT | 3 refills | Status: DC

## 2017-10-06 MED ORDER — GLYCOPYRROLATE-FORMOTEROL 9-4.8 MCG/ACT IN AERO: 2.0000 | INHALATION_SPRAY | Freq: Two times a day (BID) | RESPIRATORY_TRACT | 11 refills | Status: DC

## 2017-10-06 NOTE — Progress Notes (Signed)
Subjective:     Patient ID: Marvin Davis, male   DOB: [birth year],    MRN: [id number]  HPI  2 yobm quit smoking 05/2016 with doe x 2016 gradually worse  Dx as GOLD III copd 12/2016       01/01/2017 1st [facility] office visit/ Jaydy Fitzhenry   Chief Complaint  Patient presents with  . Pulmonary Consult    Referred by Dr. Soyla Dryer. Pt c/o SOB for the past 3 years. He also has had some wheezing. His breathing has been getting progressively worse over the year. He is SOB when he moves too fast or when he walks 500 yards.    doe -= MMRC2 = can't walk a nl pace on a flat grade s sob but does fine slow and flat no change since admit 05/2016  Sleeps ok  Used dulera and saba hfa/ not neb am ov - easily confused between maint and prns rec Plan A = Automatic = Dulera 200 Take 2 puffs first thing in am and then another 2 puffs about 12 hours later.  Work on inhaler technique:  Plan B = Backup Only use your albuterol as a rescue medication  Plan C = Crisis - only use your albuterol nebulizer if you first try Plan B  Please schedule a follow up office visit in 6 weeks, call sooner if needed with all medications /inhalers/ solutions in hand so we can verify exactly what you are taking. This includes all medications from all doctors and over the counters    10/06/2017  f/u ov/Vivia Rosenburg re: GOLD III copd/ maint on  dulera 200 2bid  Chief Complaint  Patient presents with  . Follow-up    Breathing is unchanged. He uses his o2 occ during the day. He rarely uses his albuterol inhaler and neb 2 x per day.   Dyspnea:  MMRC3 = can't walk 100 yards even at a slow pace at a flat grade s stopping due to sob   Cough: no Sleep: ok SABA use:  Neb 2 x daily   No obvious day to day or daytime variability or assoc excess/ purulent sputum or mucus plugs or hemoptysis or cp or chest tightness, subjective wheeze or overt sinus or hb symptoms. No unusual exposure hx or h/o childhood pna/ asthma or knowledge of  premature birth.  Sleeping  Ok 2 pillow   without nocturnal  or early am exacerbation  of respiratory  c/o's or need for noct saba. Also denies any obvious fluctuation of symptoms with weather or environmental changes or other aggravating or alleviating factors except as outlined above   Current Allergies, Complete Past Medical History, Past Surgical History, Family History, and Social History were reviewed in [employer] record.  ROS  The following are not active complaints unless bolded Hoarseness, sore throat, dysphagia, dental problems, itching, sneezing,  nasal congestion or discharge of excess mucus or purulent secretions, ear ache,   fever, chills, sweats, unintended wt loss or wt gain, classically pleuritic or exertional cp,  orthopnea pnd or arm/hand swelling  or leg swelling, presyncope, palpitations, abdominal pain, anorexia, nausea, vomiting, diarrhea  or change in bowel habits or change in bladder habits, change in stools or change in urine, dysuria, hematuria,  rash, arthralgias, visual complaints, headache, numbness, weakness or ataxia or problems with walking or coordination,  change in mood or  memory.        Current Meds  Medication Sig  . albuterol (PROVENTIL) (2.5 MG/3ML) 0.083% nebulizer  solution INHALE 1 VIAL VIA NEBULIZER EVERY 4 HOURS AS NEEDED FOR WHEEZING OR SHORTNESS OF BREATH  . amLODipine (NORVASC) 10 MG tablet Take 1 tablet (10 mg total) by mouth daily.  . IRON PO Take by mouth.  . mometasone-formoterol (DULERA) 200-5 MCG/ACT AERO Inhale 2 puffs into the lungs 2 (two) times daily.  . OXYGEN 2lpm when needed per pt  [employer]  . PROVENTIL HFA 108 (90 Base) MCG/ACT inhaler INHALE 2 PUFFS BY MOUTH EVERY 6 HOURS AS NEEDED FOR COUGHING, WHEEZING, OR SHORTNESS OF BREATH               Objective:   Physical Exam      10/06/2017      190   01/01/17 171 lb (77.6 kg)  10/28/16 173 lb 4 oz (78.6 kg)  09/30/16 172 lb (78 kg)    amb  bm nad  Vital signs reviewed - Note on arrival 02 sats  94% on RA       HEENT: nl dentition / oropharynx. Nl external ear canals without cough reflex - moderate bilateral non-specific turbinate edema     NECK :  without JVD/Nodes/TM/ nl carotid upstrokes bilaterally   LUNGS: no acc muscle use,  Mod barrel  contour chest wall with bilateral  Distant bs s audible wheeze and  without cough on insp or exp maneuver and mod   Hyperresonant  to  percussion bilaterally     CV:  RRR  no s3 or murmur or increase in P2, and no edema   ABD:  soft and nontender with pos mid insp Hoover's  in the supine position. No bruits or organomegaly appreciated, bowel sounds nl  MS:   Nl gait/  ext warm without deformities, calf tenderness, cyanosis or clubbing No obvious joint restrictions   SKIN: warm and dry without lesions    NEURO:  alert, approp, nl sensorium with  no motor or cerebellar deficits apparent.               Assessment:

## 2017-10-06 NOTE — Patient Instructions (Signed)
Plan A = Automatic =  Besvespi (or dulera) Take 2 puffs first thing in am and then another 2 puffs about 12 hours later.   Work on inhaler technique:  relax and gently blow all the way out then take a nice smooth deep breath back in, triggering the inhaler at same time you start breathing in.  Hold for up to 5 seconds if you can . Rinse and gargle with water when done     Plan B = Backup Only use your albuterol as a rescue medication to be used if you can't catch your breath by resting or doing a relaxed purse lip breathing pattern.  - The less you use it, the better it will work when you need it. - Ok to use the inhaler up to 2 puffs  every 4 hours if you must but call for appointment if use goes up over your usual need - Don't leave home without it !!  (think of it like the spare tire for your car)   Plan C = Crisis - only use your albuterol nebulizer if you first try Plan B and it fails to help > ok to use the nebulizer up to every 4 hours but if start needing it regularly call for immediate appointment    If you are satisfied with your treatment plan,  let your doctor know and he/she can either refill your medications or you can return here when your prescription runs out.     If in any way you are not 100% satisfied,  please tell us.  If 100% better, tell your friends!  Pulmonary follow up is as needed

## 2017-10-08 ENCOUNTER — Encounter: Payer: Self-pay | Admitting: Internal Medicine

## 2017-10-08 NOTE — Assessment & Plan Note (Signed)
-   Quit smoking 05/2016 - Spirometry 01/01/2017  FEV1 0.66 (26%)  Ratio 42  p poor saba technique < 4 h prior   - 10/06/2017  After extensive coaching inhaler device  effectiveness =    75% so changed to bevespi and referred to [facility] and me for free samples   Pt is Group B in terms of symptom/risk and laba/lama therefore appropriate rx at this point. Main issue with Marvin Davis really is not his COPD so much as it is keeping Marvin Davis supplied with medications chronically which she can afford. Unfortunately there are no generics in this market for maintenance therapy of COPD, just rescue therapy.  I have directed Marvin Davis to [employer]'s website for assistance and supplied Marvin Davis with enough Bevespi to sort out whether or not that assistance will come through before he runs out.  Each maintenance medication was reviewed in detail including most importantly the difference between maintenance and as needed and under what circumstances the prns are to be used.  Please see AVS for specific  Instructions which are unique to this visit and I personally typed out  which were reviewed in detail in writing with the patient and a copy provided.

## 2017-10-16 ENCOUNTER — Encounter: Payer: Self-pay | Admitting: Student

## 2017-10-29 ENCOUNTER — Other Ambulatory Visit: Payer: Self-pay | Admitting: Physician Assistant

## 2017-10-30 ENCOUNTER — Encounter: Payer: Self-pay | Admitting: Student

## 2017-10-30 ENCOUNTER — Telehealth: Payer: Self-pay | Admitting: Student

## 2017-10-30 NOTE — Telephone Encounter (Signed)
LPN called patient and left voicemail for patient to call back.  LPN to notify patient he is no longer active with [employer] since 3-1-9. Pt Is to get re-enrolled with [employer] in order to process his refill request for [facility].  LPN will send letter out to patient with [employer] application attached for patient to complete.

## 2017-11-06 ENCOUNTER — Other Ambulatory Visit: Payer: Self-pay | Admitting: Physician Assistant

## 2017-11-06 MED ORDER — ALBUTEROL SULFATE HFA 108 (90 BASE) MCG/ACT IN AERS: 2.0000 | INHALATION_SPRAY | Freq: Four times a day (QID) | RESPIRATORY_TRACT | 0 refills | Status: DC | PRN

## 2017-12-03 ENCOUNTER — Other Ambulatory Visit: Payer: Self-pay | Admitting: Physician Assistant

## 2017-12-30 ENCOUNTER — Ambulatory Visit: Payer: Self-pay | Admitting: Physician Assistant

## 2017-12-30 ENCOUNTER — Other Ambulatory Visit (HOSPITAL_COMMUNITY)
Admission: RE | Admit: 2017-12-30 | Discharge: 2017-12-30 | Disposition: A | Discharge status: Home / Self Care | Payer: Self-pay | Source: Ambulatory Visit | Attending: Physician Assistant | Admitting: Physician Assistant

## 2017-12-30 ENCOUNTER — Encounter: Payer: Self-pay | Admitting: Physician Assistant

## 2017-12-30 VITALS — BP 123/67 | HR 88 | Temp 97.7°F | Ht 65.0 in | Wt 191.0 lb

## 2017-12-30 DIAGNOSIS — Z125 Encounter for screening for malignant neoplasm of prostate: Secondary | ICD-10-CM

## 2017-12-30 DIAGNOSIS — I10 Essential (primary) hypertension: Secondary | ICD-10-CM | POA: Insufficient documentation

## 2017-12-30 DIAGNOSIS — J449 Chronic obstructive pulmonary disease, unspecified: Secondary | ICD-10-CM

## 2017-12-30 DIAGNOSIS — Z550 Illiteracy and low-level literacy: Secondary | ICD-10-CM

## 2017-12-30 DIAGNOSIS — G629 Polyneuropathy, unspecified: Secondary | ICD-10-CM

## 2017-12-30 DIAGNOSIS — Z9981 Dependence on supplemental oxygen: Secondary | ICD-10-CM

## 2017-12-30 DIAGNOSIS — D649 Anemia, unspecified: Secondary | ICD-10-CM | POA: Insufficient documentation

## 2017-12-30 LAB — BASIC METABOLIC PANEL
ANION GAP: 4 — AB (ref 5–15)
BUN: 13 mg/dL (ref 8–23)
CALCIUM: 9.1 mg/dL (ref 8.9–10.3)
CO2: 33 mmol/L — AB (ref 22–32)
Chloride: 104 mmol/L (ref 98–111)
Creatinine, Ser: 0.92 mg/dL (ref 0.61–1.24)
GFR calc non Af Amer: >60 mL/min (ref >60)
Glucose, Bld: 96 mg/dL (ref 70–99)
Potassium: 3.9 mmol/L (ref 3.5–5.1)
Sodium: 141 mmol/L (ref 135–145)

## 2017-12-30 LAB — CBC
HCT: 29.4 % — ABNORMAL LOW (ref 39.0–52.0)
Hemoglobin: 9.4 g/dL — ABNORMAL LOW (ref 13.0–17.0)
MCH: 24.8 pg — ABNORMAL LOW (ref 26.0–34.0)
MCHC: 32 g/dL (ref 30.0–36.0)
MCV: 77.6 fL — AB (ref 78.0–100.0)
PLATELETS: 61 10*3/uL — AB (ref 150–400)
RBC: 3.79 MIL/uL — ABNORMAL LOW (ref 4.22–5.81)
RDW: 20 % — ABNORMAL HIGH (ref 11.5–15.5)
WBC: 1.5 10*3/uL — AB (ref 4.0–10.5)

## 2017-12-30 LAB — PSA: PROSTATIC SPECIFIC ANTIGEN: 0.24 ng/mL (ref 0.00–4.00)

## 2017-12-30 NOTE — Progress Notes (Signed)
BP 123/67 (BP Location: Left Arm, Patient Position: Sitting, Cuff Size: Normal)   Pulse 88   Temp 97.7 F (36.5 C)   Ht 5\' 5"  (1.651 m)   Wt 191 lb (86.6 kg)   SpO2 95%   BMI 31.78 kg/m    Subjective:    Patient ID: Marvin Davis, male    DOB: 10-13-1955, 62 y.o.   MRN: [id number]  HPI: Marvin Davis is a 62 y.o. male presenting on 12/30/2017 for COPD and Hypertension   HPI   Pt was on [employer] in the past but hasn't brought in the papers he needs to get renewed.   Pt just took his last amlodipine yesterday  Pt complains of numb hands and he drops stuff.  This started about a month ago  Pt went to Marvin Davis in May.  The office notes say he wants the pt on besvespi or dulera.  Pt says he was confused and couldn't follow what he was saying. .  Relevant past medical, surgical, family and social history reviewed and updated as indicated. Interim medical history since our last visit reviewed. Allergies and medications reviewed and updated.   Current Outpatient Medications:  .  albuterol (PROVENTIL HFA;VENTOLIN HFA) 108 (90 Base) MCG/ACT inhaler, INHALE 2 PUFFS INTO THE LUNGS EVERY 6 HOURS AS NEEDED FOR WHEEZING OR BREATHING, Disp: 18 g, Rfl: 3 .  albuterol (PROVENTIL) (2.5 MG/3ML) 0.083% nebulizer solution, INHALE 1 VIAL VIA NEBULIZER EVERY 4 HOURS AS NEEDED FOR WHEEZING OR SHORTNESS OF BREATH, Disp: 450 mL, Rfl: 2 .  IRON PO, Take by mouth., Disp: , Rfl:  .  OXYGEN, 2lpm when needed per pt  [employer], Disp: , Rfl:  .  amLODipine (NORVASC) 10 MG tablet, Take 1 tablet (10 mg total) by mouth daily. (Patient not taking: Reported on 12/30/2017), Disp: 30 tablet, Rfl: 1 .  amLODipine (NORVASC) 10 MG tablet, TAKE 1 Tablet BY MOUTH ONCE DAILY (Patient not taking: Reported on 12/30/2017), Disp: 90 tablet, Rfl: 0 .  DULERA 200-5 MCG/ACT AERO, INHALE 2 PUFFS BY MOUTH TWICE DAILY. RINSE MOUTH AFTER EACH USE (Patient not taking: Reported on 12/30/2017), Disp: 39 g, Rfl: 0 .   Glycopyrrolate-Formoterol (BEVESPI AEROSPHERE) 9-4.8 MCG/ACT AERO, Inhale 2 puffs into the lungs 2 (two) times daily. (Patient not taking: Reported on 12/30/2017), Disp: 1 Inhaler, Rfl: 11 .  PROVENTIL HFA 108 (90 Base) MCG/ACT inhaler, INHALE 2 PUFFS BY MOUTH EVERY 6 HOURS AS NEEDED FOR COUGHING, WHEEZING, OR SHORTNESS OF BREATH (Patient not taking: Reported on 12/30/2017), Disp: 3 Inhaler, Rfl: 2   Review of Systems  Constitutional: Negative for appetite change, chills, diaphoresis, fatigue, fever and unexpected weight change.  HENT: Negative for congestion, dental problem, drooling, ear pain, facial swelling, hearing loss, mouth sores, sneezing, sore throat, trouble swallowing and voice change.   Eyes: Negative for pain, discharge, redness, itching and visual disturbance.  Respiratory: Negative for cough, choking, shortness of breath and wheezing.   Cardiovascular: Negative for chest pain, palpitations and leg swelling.  Gastrointestinal: Negative for abdominal pain, blood in stool, constipation, diarrhea and vomiting.  Endocrine: Negative for cold intolerance, heat intolerance and polydipsia.  Genitourinary: Negative for decreased urine volume, dysuria and hematuria.  Musculoskeletal: Negative for arthralgias, back pain and gait problem.  Skin: Negative for rash.  Allergic/Immunologic: Negative for environmental allergies.  Neurological: Negative for seizures, syncope, light-headedness and headaches.  Hematological: Negative for adenopathy.  Psychiatric/Behavioral: Negative for agitation, dysphoric mood and suicidal ideas. The patient is not  nervous/anxious.     Per HPI unless specifically indicated above     Objective:    BP 123/67 (BP Location: Left Arm, Patient Position: Sitting, Cuff Size: Normal)   Pulse 88   Temp 97.7 F (36.5 C)   Ht 5\' 5"  (1.651 m)   Wt 191 lb (86.6 kg)   SpO2 95%   BMI 31.78 kg/m   Wt Readings from Last 3 Encounters:  12/30/17 191 lb (86.6 kg)  10/06/17  190 lb (86.2 kg)  09/29/17 187 lb 12 oz (85.2 kg)    Physical Exam  Constitutional: He is oriented to person, place, and time. He appears well-developed and well-nourished.  HENT:  Head: Normocephalic and atraumatic.  Neck: Neck supple.  Cardiovascular: Normal rate and regular rhythm.  Pulmonary/Chest: Effort normal and breath sounds normal. He has no wheezes.  Abdominal: Soft. Bowel sounds are normal. There is no hepatosplenomegaly. There is no tenderness.  Musculoskeletal: He exhibits no edema.       Right hand: He exhibits normal range of motion, no tenderness and no bony tenderness. Decreased strength noted.       Left hand: He exhibits normal range of motion, no tenderness and no bony tenderness. Decreased strength noted.  Lymphadenopathy:    He has no cervical adenopathy.  Neurological: He is alert and oriented to person, place, and time.  Skin: Skin is warm and dry.  Psychiatric: He has a normal mood and affect. His behavior is normal.  Vitals reviewed.   O2 sat 95% on supplemental oxygen     Assessment & Plan:   Encounter Diagnoses  Name Primary?  . Essential hypertension Yes  . Chronic obstructive pulmonary disease, unspecified COPD type ([place])   . Anemia, unspecified type   . Neuropathy   . Screening for prostate cancer   . Dependence on supplemental oxygen   . 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  Will call with lab results -no changes to medications today -pt counseled to avoid alcohol as that may be contributing to his neuropathy.  Will check b-12 and folate labs today -pt to follow up in 3 months.  RTO sooner prn

## 2018-01-08 ENCOUNTER — Encounter: Payer: Self-pay | Admitting: Student

## 2018-02-02 ENCOUNTER — Other Ambulatory Visit: Payer: Self-pay | Admitting: Physician Assistant

## 2018-02-04 ENCOUNTER — Other Ambulatory Visit: Payer: Self-pay | Admitting: Physician Assistant

## 2018-02-04 MED ORDER — ALBUTEROL SULFATE HFA 108 (90 BASE) MCG/ACT IN AERS: INHALATION_SPRAY | RESPIRATORY_TRACT | 1 refills | Status: DC

## 2018-02-04 MED ORDER — MOMETASONE FURO-FORMOTEROL FUM 200-5 MCG/ACT IN AERO: INHALATION_SPRAY | RESPIRATORY_TRACT | 0 refills | Status: DC

## 2018-02-04 MED ORDER — AMLODIPINE BESYLATE 10 MG PO TABS: 10.0000 mg | ORAL_TABLET | Freq: Every day | ORAL | 1 refills | Status: DC

## 2018-02-04 MED ORDER — ALBUTEROL SULFATE (2.5 MG/3ML) 0.083% IN NEBU: INHALATION_SOLUTION | RESPIRATORY_TRACT | 1 refills | Status: DC

## 2018-02-11 ENCOUNTER — Other Ambulatory Visit: Payer: Self-pay | Admitting: Physician Assistant

## 2018-03-23 ENCOUNTER — Emergency Department (HOSPITAL_COMMUNITY): Payer: Medicaid Other

## 2018-03-23 ENCOUNTER — Other Ambulatory Visit: Payer: Self-pay

## 2018-03-23 ENCOUNTER — Encounter (HOSPITAL_COMMUNITY): Payer: Self-pay | Admitting: Emergency Medicine

## 2018-03-23 ENCOUNTER — Inpatient Hospital Stay (HOSPITAL_COMMUNITY): Payer: Medicaid Other

## 2018-03-23 ENCOUNTER — Inpatient Hospital Stay (HOSPITAL_COMMUNITY)
Admission: EM | Admit: 2018-03-23 | Discharge: 2018-04-02 | DRG: 193 | Disposition: A | Discharge status: Other Acute Inpatient Hospital | Payer: Medicaid Other | Attending: Internal Medicine | Admitting: Internal Medicine

## 2018-03-23 DIAGNOSIS — R0602 Shortness of breath: Secondary | ICD-10-CM | POA: Diagnosis present

## 2018-03-23 DIAGNOSIS — D630 Anemia in neoplastic disease: Secondary | ICD-10-CM | POA: Diagnosis present

## 2018-03-23 DIAGNOSIS — J9621 Acute and chronic respiratory failure with hypoxia: Secondary | ICD-10-CM | POA: Diagnosis present

## 2018-03-23 DIAGNOSIS — C7801 Secondary malignant neoplasm of right lung: Secondary | ICD-10-CM | POA: Diagnosis present

## 2018-03-23 DIAGNOSIS — Z77090 Contact with and (suspected) exposure to asbestos: Secondary | ICD-10-CM | POA: Diagnosis present

## 2018-03-23 DIAGNOSIS — J9601 Acute respiratory failure with hypoxia: Secondary | ICD-10-CM | POA: Diagnosis present

## 2018-03-23 DIAGNOSIS — J441 Chronic obstructive pulmonary disease with (acute) exacerbation: Secondary | ICD-10-CM | POA: Diagnosis present

## 2018-03-23 DIAGNOSIS — Z87891 Personal history of nicotine dependence: Secondary | ICD-10-CM

## 2018-03-23 DIAGNOSIS — D649 Anemia, unspecified: Secondary | ICD-10-CM | POA: Diagnosis present

## 2018-03-23 DIAGNOSIS — N179 Acute kidney failure, unspecified: Secondary | ICD-10-CM | POA: Diagnosis present

## 2018-03-23 DIAGNOSIS — E860 Dehydration: Secondary | ICD-10-CM | POA: Diagnosis present

## 2018-03-23 DIAGNOSIS — Z8249 Family history of ischemic heart disease and other diseases of the circulatory system: Secondary | ICD-10-CM | POA: Diagnosis not present

## 2018-03-23 DIAGNOSIS — D61818 Other pancytopenia: Secondary | ICD-10-CM | POA: Diagnosis present

## 2018-03-23 DIAGNOSIS — Z833 Family history of diabetes mellitus: Secondary | ICD-10-CM | POA: Diagnosis not present

## 2018-03-23 DIAGNOSIS — I10 Essential (primary) hypertension: Secondary | ICD-10-CM | POA: Diagnosis present

## 2018-03-23 DIAGNOSIS — N2889 Other specified disorders of kidney and ureter: Secondary | ICD-10-CM | POA: Diagnosis present

## 2018-03-23 DIAGNOSIS — Z823 Family history of stroke: Secondary | ICD-10-CM | POA: Diagnosis not present

## 2018-03-23 DIAGNOSIS — C9200 Acute myeloblastic leukemia, not having achieved remission: Secondary | ICD-10-CM | POA: Diagnosis present

## 2018-03-23 DIAGNOSIS — J440 Chronic obstructive pulmonary disease with acute lower respiratory infection: Secondary | ICD-10-CM | POA: Diagnosis present

## 2018-03-23 DIAGNOSIS — Z79899 Other long term (current) drug therapy: Secondary | ICD-10-CM

## 2018-03-23 DIAGNOSIS — J189 Pneumonia, unspecified organism: Secondary | ICD-10-CM | POA: Diagnosis present

## 2018-03-23 DIAGNOSIS — Z9981 Dependence on supplemental oxygen: Secondary | ICD-10-CM

## 2018-03-23 DIAGNOSIS — D696 Thrombocytopenia, unspecified: Secondary | ICD-10-CM | POA: Diagnosis present

## 2018-03-23 DIAGNOSIS — R5081 Fever presenting with conditions classified elsewhere: Secondary | ICD-10-CM | POA: Diagnosis present

## 2018-03-23 DIAGNOSIS — J449 Chronic obstructive pulmonary disease, unspecified: Secondary | ICD-10-CM | POA: Diagnosis present

## 2018-03-23 HISTORY — DX: Dependence on supplemental oxygen: Z99.81

## 2018-03-23 HISTORY — DX: Thrombocytopenia, unspecified: D69.6

## 2018-03-23 HISTORY — DX: Anemia, unspecified: D64.9

## 2018-03-23 HISTORY — DX: Polyneuropathy, unspecified: G62.9

## 2018-03-23 LAB — CREATININE, SERUM
CREATININE: 1.19 mg/dL (ref 0.61–1.24)
GFR calc Af Amer: >60 mL/min (ref >60)

## 2018-03-23 LAB — BASIC METABOLIC PANEL
ANION GAP: 9 (ref 5–15)
BUN: 25 mg/dL — ABNORMAL HIGH (ref 8–23)
CALCIUM: 9.1 mg/dL (ref 8.9–10.3)
CO2: 30 mmol/L (ref 22–32)
CREATININE: 1.41 mg/dL — AB (ref 0.61–1.24)
Chloride: 100 mmol/L (ref 98–111)
GFR calc Af Amer: 60 mL/min — ABNORMAL LOW (ref >60)
GFR, EST NON AFRICAN AMERICAN: 52 mL/min — AB (ref >60)
GLUCOSE: 138 mg/dL — AB (ref 70–99)
Potassium: 4.3 mmol/L (ref 3.5–5.1)
Sodium: 139 mmol/L (ref 135–145)

## 2018-03-23 LAB — CBC
HCT: 18.6 % — ABNORMAL LOW (ref 39.0–52.0)
HEMATOCRIT: 20.3 % — AB (ref 39.0–52.0)
HEMOGLOBIN: 6.5 g/dL — AB (ref 13.0–17.0)
Hemoglobin: 6 g/dL — CL (ref 13.0–17.0)
MCH: 28.2 pg (ref 26.0–34.0)
MCH: 28.1 pg (ref 26.0–34.0)
MCHC: 32.3 g/dL (ref 30.0–36.0)
MCHC: 32 g/dL (ref 30.0–36.0)
MCV: 87.3 fL (ref 80.0–100.0)
MCV: 87.9 fL (ref 80.0–100.0)
PLATELETS: 67 10*3/uL — AB (ref 150–400)
Platelets: 49 10*3/uL — ABNORMAL LOW (ref 150–400)
RBC: 2.13 MIL/uL — AB (ref 4.22–5.81)
RBC: 2.31 MIL/uL — ABNORMAL LOW (ref 4.22–5.81)
RDW: 19.9 % — AB (ref 11.5–15.5)
RDW: 18.6 % — ABNORMAL HIGH (ref 11.5–15.5)
WBC: 1 10*3/uL — CL (ref 4.0–10.5)
WBC: 0.4 10*3/uL — AB (ref 4.0–10.5)
nRBC: 2 % — ABNORMAL HIGH (ref 0.0–0.2)
nRBC: 5.7 % — ABNORMAL HIGH (ref 0.0–0.2)

## 2018-03-23 LAB — IRON AND TIBC
Iron: 63 ug/dL (ref 45–182)
Saturation Ratios: 40 % — ABNORMAL HIGH (ref 17.9–39.5)
TIBC: 159 ug/dL — ABNORMAL LOW (ref 250–450)
UIBC: 96 ug/dL

## 2018-03-23 LAB — MRSA PCR SCREENING: MRSA by PCR: NEGATIVE

## 2018-03-23 LAB — TROPONIN I: TROPONIN I: 0.08 ng/mL — AB (ref <0.03)

## 2018-03-23 LAB — RETICULOCYTES
Immature Retic Fract: 22.6 % — ABNORMAL HIGH (ref 2.3–15.9)
RBC.: 2.31 MIL/uL — AB (ref 4.22–5.81)
RETIC COUNT ABSOLUTE: 16.4 10*3/uL — AB (ref 19.0–186.0)
Retic Ct Pct: 0.7 % (ref 0.4–3.1)

## 2018-03-23 LAB — DIFFERENTIAL
BASOS PCT: 0 %
Basophils Absolute: 0 10*3/uL (ref 0.0–0.1)
EOS ABS: 0 10*3/uL (ref 0.0–0.5)
Eosinophils Relative: 1 %
Lymphocytes Relative: 58 %
Lymphs Abs: 0.6 10*3/uL — ABNORMAL LOW (ref 0.7–4.0)
MONOS PCT: 12 %
Monocytes Absolute: 0.1 10*3/uL (ref 0.1–1.0)
NEUTROS ABS: 0.3 10*3/uL — AB (ref 1.7–7.7)
NEUTROS PCT: 27 %
NRBC: 3 /100{WBCs} — AB

## 2018-03-23 LAB — FOLATE: FOLATE: 13.8 ng/mL (ref >5.9)

## 2018-03-23 LAB — ABO/RH: ABO/RH(D): O POS

## 2018-03-23 LAB — POC OCCULT BLOOD, ED: FECAL OCCULT BLD: NEGATIVE

## 2018-03-23 LAB — PREPARE RBC (CROSSMATCH)

## 2018-03-23 LAB — FERRITIN: Ferritin: 2135 ng/mL — ABNORMAL HIGH (ref 24–336)

## 2018-03-23 LAB — VITAMIN B12: VITAMIN B 12: 287 pg/mL (ref 180–914)

## 2018-03-23 MED ORDER — GUAIFENESIN ER 600 MG PO TB12
1200.0000 mg | ORAL_TABLET | Freq: Two times a day (BID) | ORAL | Status: DC
  Administered 2018-03-23 – 2018-04-02 (×21): 1200 mg via ORAL
  Filled 2018-03-23 (×21): qty 2

## 2018-03-23 MED ORDER — LEVALBUTEROL HCL 1.25 MG/0.5ML IN NEBU: 1.2500 mg | INHALATION_SOLUTION | Freq: Once | RESPIRATORY_TRACT | Status: DC

## 2018-03-23 MED ORDER — CHLORHEXIDINE GLUCONATE 0.12 % MT SOLN
15.0000 mL | Freq: Two times a day (BID) | OROMUCOSAL | Status: DC
  Administered 2018-03-23 – 2018-04-02 (×21): 15 mL via OROMUCOSAL
  Filled 2018-03-23 (×19): qty 15

## 2018-03-23 MED ORDER — AMLODIPINE BESYLATE 10 MG PO TABS
10.0000 mg | ORAL_TABLET | Freq: Every day | ORAL | Status: DC
  Administered 2018-03-23 – 2018-04-02 (×11): 10 mg via ORAL
  Filled 2018-03-23 (×2): qty 1
  Filled 2018-03-23 (×8): qty 2
  Filled 2018-03-23: qty 1

## 2018-03-23 MED ORDER — SODIUM CHLORIDE 0.9 % IV SOLN
10.0000 mL/h | Freq: Once | INTRAVENOUS | Status: AC
  Administered 2018-03-23: 10 mL/h via INTRAVENOUS

## 2018-03-23 MED ORDER — IPRATROPIUM BROMIDE 0.02 % IN SOLN
1.0000 mg | Freq: Once | RESPIRATORY_TRACT | Status: AC
  Administered 2018-03-23: 1 mg via RESPIRATORY_TRACT
  Filled 2018-03-23: qty 5

## 2018-03-23 MED ORDER — METHYLPREDNISOLONE SODIUM SUCC 125 MG IJ SOLR
60.0000 mg | Freq: Four times a day (QID) | INTRAMUSCULAR | Status: AC
  Administered 2018-03-23 – 2018-03-24 (×4): 60 mg via INTRAVENOUS
  Filled 2018-03-23 (×4): qty 2

## 2018-03-23 MED ORDER — IPRATROPIUM-ALBUTEROL 0.5-2.5 (3) MG/3ML IN SOLN
3.0000 mL | Freq: Four times a day (QID) | RESPIRATORY_TRACT | Status: DC
  Administered 2018-03-23 – 2018-03-31 (×29): 3 mL via RESPIRATORY_TRACT
  Filled 2018-03-23 (×29): qty 3

## 2018-03-23 MED ORDER — ORAL CARE MOUTH RINSE
15.0000 mL | Freq: Two times a day (BID) | OROMUCOSAL | Status: DC
  Administered 2018-03-23 – 2018-03-29 (×8): 15 mL via OROMUCOSAL

## 2018-03-23 MED ORDER — METHYLPREDNISOLONE SODIUM SUCC 125 MG IJ SOLR
125.0000 mg | Freq: Once | INTRAMUSCULAR | Status: AC
  Administered 2018-03-23: 125 mg via INTRAVENOUS
  Filled 2018-03-23: qty 2

## 2018-03-23 MED ORDER — SODIUM CHLORIDE 0.9 % IV SOLN
1.0000 g | INTRAVENOUS | Status: DC
  Administered 2018-03-23 – 2018-03-26 (×4): 1 g via INTRAVENOUS
  Filled 2018-03-23: qty 10
  Filled 2018-03-23: qty 1
  Filled 2018-03-23 (×2): qty 10
  Filled 2018-03-23 (×2): qty 1
  Filled 2018-03-23: qty 10

## 2018-03-23 MED ORDER — MOMETASONE FURO-FORMOTEROL FUM 200-5 MCG/ACT IN AERO
2.0000 | INHALATION_SPRAY | Freq: Two times a day (BID) | RESPIRATORY_TRACT | Status: DC
  Administered 2018-03-24 – 2018-03-27 (×7): 2 via RESPIRATORY_TRACT
  Filled 2018-03-23: qty 8.8

## 2018-03-23 MED ORDER — PREDNISONE 20 MG PO TABS: 40.0000 mg | ORAL_TABLET | Freq: Every day | ORAL | Status: DC

## 2018-03-23 MED ORDER — SODIUM CHLORIDE 0.9 % IV SOLN
INTRAVENOUS | Status: DC
  Administered 2018-03-23 – 2018-03-28 (×9): via INTRAVENOUS

## 2018-03-23 MED ORDER — SODIUM CHLORIDE 0.9 % IV SOLN
500.0000 mg | INTRAVENOUS | Status: DC
  Administered 2018-03-23 – 2018-03-26 (×4): 500 mg via INTRAVENOUS
  Filled 2018-03-23 (×7): qty 500

## 2018-03-23 MED ORDER — ENOXAPARIN SODIUM 40 MG/0.4ML ~~LOC~~ SOLN: 40.0000 mg | SUBCUTANEOUS | Status: DC

## 2018-03-23 MED ORDER — ALBUTEROL (5 MG/ML) CONTINUOUS INHALATION SOLN
10.0000 mg/h | INHALATION_SOLUTION | Freq: Once | RESPIRATORY_TRACT | Status: AC
  Administered 2018-03-23: 10 mg/h via RESPIRATORY_TRACT
  Filled 2018-03-23: qty 20

## 2018-03-23 MED ORDER — ALBUTEROL SULFATE (2.5 MG/3ML) 0.083% IN NEBU: 2.5000 mg | INHALATION_SOLUTION | RESPIRATORY_TRACT | Status: DC | PRN

## 2018-03-23 MED ORDER — ALBUTEROL SULFATE (2.5 MG/3ML) 0.083% IN NEBU
5.0000 mg | INHALATION_SOLUTION | Freq: Once | RESPIRATORY_TRACT | Status: AC
  Administered 2018-03-23: 5 mg via RESPIRATORY_TRACT
  Filled 2018-03-23: qty 6

## 2018-03-23 NOTE — Progress Notes (Signed)
CRITICAL VALUE ALERT  Critical Value:  WBC 0.4, HGB 6.5  Date & Time Notied:  03/23/18 1625  Provider Notified: Memon  Orders Received/Actions taken: No new orders

## 2018-03-23 NOTE — ED Triage Notes (Signed)
Increased sob since last night.  resp labored with use of accessory muscles.  Wears 2l o2 via [place] at all times.

## 2018-03-23 NOTE — ED Provider Notes (Signed)
[facility] EMERGENCY DEPARTMENT Provider Note   CSN: [id number] Arrival date & time: 03/23/18  2423     History   Chief Complaint Chief Complaint  Patient presents with  . Respiratory Distress    HPI Marvin Davis is a 62 y.o. male.  HPI  Pt was seen at 0930.  Per pt, c/o gradual onset and worsening of persistent SOB and wheezing for the past 1 month.  Describes his symptoms as "I got COPD."  Has been associated with constant generalized chest "tightness," also for the past 1 month. Has been using home O2 N/C and nebs without relief. Pt states he ran out of his MDI.  Denies palpitations, no back pain, no abd pain, no N/V/D, no fevers, no rash.    Past Medical History:  Diagnosis Date  . Anemia   . COPD (chronic obstructive pulmonary disease) ([place])   . On home O2    2L N/C  . Peripheral neuropathy   . Thrombocytopenia [facility])     Patient Active Problem List   Diagnosis Date Noted  . Unable to read or write 01/28/2017  . Anemia 01/28/2017  . Thrombocytopenia ([place]) 01/28/2017  . Acute respiratory failure with hypoxia ([place]) 06/17/2016  . COPD  GOLD III  06/17/2016  . Edema of both legs 06/17/2016  . Dyspnea 06/17/2016  . Hypertension 06/17/2016  . Bronchospasm, acute 06/17/2016  . Knee sprain 11/23/2013    History reviewed. No pertinent surgical history.      Home Medications    Prior to Admission medications   Medication Sig Start Date End Date Taking? Authorizing Provider  albuterol (PROVENTIL HFA;VENTOLIN HFA) 108 (90 Base) MCG/ACT inhaler INHALE 2 PUFFS INTO THE LUNGS EVERY 6 HOURS AS NEEDED FOR WHEEZING OR BREATHING 02/04/18   Soyla Dryer, PA-C  albuterol (PROVENTIL) (2.5 MG/3ML) 0.083% nebulizer solution INHALE 1 VIAL VIA NEBULIZER EVERY 4 HOURS AS NEEDED FOR WHEEZING OR SHORTNESS OF BREATH 02/04/18   Soyla Dryer, PA-C  amLODipine (NORVASC) 10 MG tablet Take 1 tablet (10 mg total) by mouth daily. Patient not taking: Reported on 12/30/2017  09/30/17   Soyla Dryer, PA-C  amLODipine (NORVASC) 10 MG tablet TAKE 1 TABLET BY MOUTH ONCE DAILY 02/02/18   Soyla Dryer, PA-C  amLODipine (NORVASC) 10 MG tablet Take 1 tablet (10 mg total) by mouth daily. 02/04/18   Soyla Dryer, PA-C  Glycopyrrolate-Formoterol (BEVESPI AEROSPHERE) 9-4.8 MCG/ACT AERO Inhale 2 puffs into the lungs 2 (two) times daily. Patient not taking: Reported on 12/30/2017 10/06/17   Tanda Rockers, MD  IRON PO Take by mouth.    [provider]  mometasone-formoterol (DULERA) 200-5 MCG/ACT AERO INHALE 2 PUFFS BY MOUTH TWICE DAILY. RINSE MOUTH AFTER EACH USE 02/11/18   Soyla Dryer, PA-C  OXYGEN 2lpm when needed per pt  [facility]    [provider]  PROVENTIL HFA 108 [phone] Base) MCG/ACT inhaler INHALE 2 PUFFS BY MOUTH EVERY 6 HOURS AS NEEDED FOR COUGHING, WHEEZING, OR SHORTNESS OF BREATH Patient not taking: Reported on 12/30/2017 12/02/16   Soyla Dryer, PA-C    Family History Family History  Problem Relation Age of Onset  . Hypertension Father   . Stroke Father   . Diabetes Brother     Social History Social History   Tobacco Use  . Smoking status: Former Smoker    Packs/day: 1.50    Years: 46.00    Pack years: 69.00    Types: Cigarettes    Last attempt to quit: 06/26/2016  Years since quitting: 1.7  . Smokeless tobacco: Never Used  Substance Use Topics  . Alcohol use: Yes    Comment: every week   . Drug use: No    Types: Marijuana, Cocaine    Comment: Last marijuana use 2015. Last cocaine use 2016     Allergies   Patient has no known allergies.   Review of Systems Review of Systems ROS: Statement: All systems negative except as marked or noted in the HPI; Constitutional: Negative for fever and chills. ; ; Eyes: Negative for eye pain, redness and discharge. ; ; ENMT: Negative for ear pain, hoarseness, nasal congestion, sinus pressure and sore throat. ; ; Cardiovascular: Negative for chest pain, palpitations,  diaphoresis, and peripheral edema. ; ; Respiratory: +SOB, wheezing. Negative for cough and stridor. ; ; Gastrointestinal: Negative for nausea, vomiting, diarrhea, abdominal pain, blood in stool, hematemesis, jaundice and rectal bleeding. . ; ; Genitourinary: Negative for dysuria, flank pain and hematuria. ; ; Musculoskeletal: Negative for back pain and neck pain. Negative for swelling and trauma.; ; Skin: Negative for pruritus, rash, abrasions, blisters, bruising and skin lesion.; ; Neuro: Negative for headache, lightheadedness and neck stiffness. Negative for weakness, altered level of consciousness, altered mental status, extremity weakness, paresthesias, involuntary movement, seizure and syncope.       Physical Exam Updated Vital Signs BP 126/67   Pulse (!) 107   Temp 98.4 F (36.9 C) (Oral)   Resp (!) 30   Ht 5\' 5"  (1.651 m)   Wt 82.6 kg   SpO2 94%   BMI 30.29 kg/m    Patient Vitals for the past 24 hrs:  BP Temp Temp src Pulse Resp SpO2 Height Weight  03/23/18 1000 126/67 - - (!) 107 (!) 30 94 % - -  03/23/18 0922 136/89 - - - - - - -  03/23/18 0918 - 98.4 F (36.9 C) Oral (!) 109 (!) 35 97 % - -  03/23/18 0914 - - - - - - 5\' 5"  (1.651 m) 82.6 kg     Physical Exam 0935: Physical examination:  Nursing notes reviewed; Vital signs and O2 SAT reviewed;  Constitutional: Well developed, Well nourished, Well hydrated, Uncomfortable appearing.;; Head:  Normocephalic, atraumatic; Eyes: EOMI, PERRL, No scleral icterus. Conjunctiva pale.; ENMT: Mouth and pharynx normal, Mucous membranes moist; Neck: Supple, Full range of motion, No lymphadenopathy; Cardiovascular: Tachycardic rate and rhythm, No gallop; Respiratory: Breath sounds diminished & equal bilaterally, faint insp/exp wheezes bilat. Faint audible wheezing.  Speaking short sentences, sitting upright, tachypneic.; Chest: Nontender, Movement normal; Abdomen: Soft, Nontender, Nondistended, Normal bowel sounds. Rectal exam performed  w/permission of pt and ED RN chaperone present.  Anal tone normal.  Non-tender, soft yellow stool in rectal vault, heme neg.  No fissures, no external hemorrhoids, no palp masses.;;; Genitourinary: No CVA tenderness; Extremities: Pulses normal, No tenderness, No edema, No calf edema or asymmetry.; Neuro: AA&Ox3, Major CN grossly intact.  Speech clear. No gross focal motor or sensory deficits in extremities.; Skin: Color normal, Warm, Dry.    ED Treatments / Results  Labs (all labs ordered are listed, but only abnormal results are displayed)   EKG EKG Interpretation  Date/Time:  Monday March 23 2018 09:16:30 EDT Ventricular Rate:  113 PR Interval:    QRS Duration: 163 QT Interval:  317 QTC Calculation: 435 R Axis:   67 Text Interpretation:  Sinus tachycardia Nonspecific intraventricular conduction delay Baseline wander Artifact When compared with ECG of 06/17/2016 Rate faster Confirmed by Francine Graven (  [id number]) on 03/23/2018 9:48:06 AM   Radiology   Procedures Procedures (including critical care time)  Medications Ordered in ED Medications  albuterol (PROVENTIL,VENTOLIN) solution continuous neb (has no administration in time range)  ipratropium (ATROVENT) nebulizer solution 1 mg (has no administration in time range)  albuterol (PROVENTIL) (2.5 MG/3ML) 0.083% nebulizer solution 5 mg (5 mg Nebulization Given 03/23/18 0925)  methylPREDNISolone sodium succinate (SOLU-MEDROL) 125 mg/2 mL injection 125 mg (125 mg Intravenous Given 03/23/18 1010)     Initial Impression / Assessment and Plan / ED Course  I have reviewed the triage vital signs and the nursing notes.  Pertinent labs & imaging results that were available during my care of the patient were reviewed by me and considered in my medical decision making (see chart for details).  MDM Reviewed: previous chart, nursing note and vitals Reviewed previous: labs and ECG Interpretation: labs, ECG and x-ray Total time  providing critical care: 30-74 minutes. This excludes time spent performing separately reportable procedures and services. Consults: admitting MD   CRITICAL CARE Performed by: Francine Graven Total critical care time: 45 minutes Critical care time was exclusive of separately billable procedures and treating other patients. Critical care was necessary to treat or prevent imminent or life-threatening deterioration. Critical care was time spent personally by me on the following activities: development of treatment plan with patient and/or surrogate as well as nursing, discussions with consultants, evaluation of patient's response to treatment, examination of patient, obtaining history from patient or surrogate, ordering and performing treatments and interventions, ordering and review of laboratory studies, ordering and review of radiographic studies, pulse oximetry and re-evaluation of patient's condition.   Results for orders placed or performed during the hospital encounter of 03/23/18  Culture, blood (routine x 2) Call MD if unable to obtain prior to antibiotics being given  Result Value Ref Range   Specimen Description BLOOD RIGHT ARM    Special Requests      BOTTLES DRAWN AEROBIC ONLY Blood Culture adequate volume Performed at [facility], [address] [place], [place] [address]    Culture PENDING    Report Status PENDING   Basic metabolic panel  Result Value Ref Range   Sodium 139 135 - 145 mmol/L   Potassium 4.3 3.5 - 5.1 mmol/L   Chloride 100 98 - 111 mmol/L   CO2 30 22 - 32 mmol/L   Glucose, Bld 138 (H) 70 - 99 mg/dL   BUN 25 (H) 8 - 23 mg/dL   Creatinine, Ser 1.41 (H) 0.61 - 1.24 mg/dL   Calcium 9.1 8.9 - 10.3 mg/dL   GFR calc non Af Amer 52 (L) >60 mL/min   GFR calc Af Amer 60 (L) >60 mL/min   Anion gap 9 5 - 15  CBC  Result Value Ref Range   WBC 1.0 (LL) 4.0 - 10.5 K/uL   RBC 2.13 (L) 4.22 - 5.81 MIL/uL   Hemoglobin 6.0 (LL) 13.0 - 17.0 g/dL   HCT 18.6 (L) 39.0 -  52.0 %   MCV 87.3 80.0 - 100.0 fL   MCH 28.2 26.0 - 34.0 pg   MCHC 32.3 30.0 - 36.0 g/dL   RDW 19.9 (H) 11.5 - 15.5 %   Platelets 67 (L) 150 - 400 K/uL   nRBC 2.0 (H) 0.0 - 0.2 %  Troponin I  Result Value Ref Range   Troponin I 0.08 (HH) <0.03 ng/mL  POC occult blood, ED  Result Value Ref Range   Fecal Occult Bld NEGATIVE NEGATIVE  Type and screen  Result Value Ref Range   ABO/RH(D) O POS    Antibody Screen NEG    Sample Expiration      03/26/2018 Performed at [facility], [address], [place], [place] [address]    Unit Number [id number]    Blood Component Type RBC LR PHER1    Unit division 00    Status of Unit ALLOCATED    Transfusion Status OK TO TRANSFUSE    Crossmatch Result Compatible    Unit Number [id number]    Blood Component Type RBC LR PHER2    Unit division 00    Status of Unit ALLOCATED    Transfusion Status OK TO TRANSFUSE    Crossmatch Result Compatible   Prepare RBC  Result Value Ref Range   Order Confirmation      ORDER PROCESSED BY BLOOD BANK Performed at [facility], [address] [place], [place] [address]   ABO/Rh  Result Value Ref Range   ABO/RH(D)      O POS Performed at [facility], [address], [place], [place] [address]   BPAM [facility]  Result Value Ref Range   Blood Product Unit Number [id number]    Unit Type and Rh 5100    Blood Product Expiration Date [id number]    Blood Product Unit Number [id number]    Unit Type and Rh 5100    Blood Product Expiration Date [id number]    Dg Chest Port 1 View Result Date: 03/23/2018 CLINICAL DATA:  Increased shortness of breath since last night. EXAM: PORTABLE CHEST 1 VIEW COMPARISON:  One-view chest x-ray 06/21/2016 FINDINGS: The heart size is normal. The aortic arch is within normal limits. New right middle lobe airspace disease is consistent with pneumonia. Minimal left basilar atelectasis is present. IMPRESSION: 1. Right middle lobe pneumonia. 2. Minimal left basilar  atelectasis. Electronically Signed   By: San Morelle M.D.   On: 03/23/2018 10:08    Results for BRYCIN, KILLE (MRN [id number]) as of 03/23/2018 11:40  Ref. Range 04/02/2017 08:25 06/23/2017 09:29 10/01/2017 10:34 12/30/2017 09:37 03/23/2018 09:21  WBC Latest Ref Range: 4.0 - 10.5 K/uL 4.5 4.4 2.7 (L) 1.5 (L) 1.0 (LL)  Hemoglobin Latest Ref Range: 13.0 - 17.0 g/dL 11.0 (L) 10.0 (L) 10.2 (L) 9.4 (L) 6.0 (LL)  HCT Latest Ref Range: 39.0 - 52.0 % 33.7 (L) 31.8 (L) 32.0 (L) 29.4 (L) 18.6 (L)  Platelets Latest Ref Range: 150 - 400 K/uL 159 167 151 61 (L) 67 (L)    1130:  On arrival: pt sitting upright, tachypneic, tachycardic, Sats 97 % on O2 3L N/C, lungs diminished with wheezing. IV solumedrol and hour long neb started. While on neb: pt appears more comfortable at rest, less tachypneic, Sats 100 %, lungs continue diminished without audible wheezing. When pt moves around on stretcher (changes position), pt appears visibly SOB and audible wheezing is again heard. H/H lower than previous:  Pt states his stools have been "dark but I take iron," stool here is yellow colored and heme negative, PRBC transfusion started. CXR with pneumonia: IV abx started. BUN/Cr mildly elevated: judicious IVF started. Troponin mildly elevated, but EKG without acute STTW changes; likely demand from SOB and anemia. Concern to give ASA as pt is pancytopenic. Dx and testing d/w pt.  Questions answered.  Verb understanding, agreeable to admit. T/C returned from [facility] Roderic Palau, case discussed, including:  HPI, pertinent PM/SHx, VS/PE, dx testing, ED course and treatment:  Agreeable to admit.  Final Clinical Impressions(s) / ED Diagnoses   Final diagnoses:  None    ED Discharge Orders    None       Francine Graven, DO 03/24/18 2051

## 2018-03-23 NOTE — ED Notes (Signed)
Date and time results received: 03/23/18 9:56 AM  (use smartphrase ".now" to insert current time)  Test: hgb Critical Value: 6  Name of Provider Notified: Mcmanus  Orders Received? Or Actions Taken?:

## 2018-03-23 NOTE — ED Notes (Signed)
Date and time results received: 03/23/18 1015 (use smartphrase ".now" to insert current time)  Test: trop Critical Value: 0.08  Name of Provider Notified: mcmanus  Orders Received? Or Actions Taken?: none

## 2018-03-23 NOTE — H&P (Addendum)
History and Physical    LANNIS LICHTENWALNER WUJ:[id number] DOB: [birth year] DOA: 03/23/2018  PCP: Soyla Dryer, PA-C  Patient coming from: Home  I have personally briefly reviewed patient's old medical records in [place]  Chief Complaint: Shortness of breath  HPI: Marvin Davis is a 62 y.o. male with medical history significant of COPD.  History is somewhat limited since he is currently on BiPAP and no family is present to offer further history.  Per medical record, patient has been short of breath for the past month.  He had increased wheezing that has not improved with his nebulizer treatments.  Over the past few days he had a worsening cough.  He describes a tightness across his chest.  No reported fever, nausea, vomiting or diarrhea  ED Course: He was noted to have increased wheezing and work of breathing.  Chest x-ray indicated pneumonia.  He was noted to have significant anemia with a hemoglobin of 6.  He was also leukopenic with a WBC count of 1 and thrombocytopenic with a platelet count of the 60s.  He had mild elevation of creatinine to 1.4.  He was placed on BiPAP for increased work of breathing.  He received nebulizer treatments, steroids and antibiotics.  Review of Systems: As per HPI otherwise 10 point review of systems negative.    Past Medical History:  Diagnosis Date  . Anemia   . COPD (chronic obstructive pulmonary disease) ([place])   . On home O2    2L N/C  . Peripheral neuropathy   . Thrombocytopenia ([place])     History reviewed. No pertinent surgical history.  Social History:  reports that he quit smoking about 20 months ago. His smoking use included cigarettes. He has a 69.00 pack-year smoking history. He has never used smokeless tobacco. He reports that he drinks alcohol. He reports that he does not use drugs.  No Known Allergies  Family History  Problem Relation Age of Onset  . Hypertension Father   . Stroke Father   . Diabetes Brother      Family history: Family history reviewed and not pertinent  Prior to Admission medications   Medication Sig Start Date End Date Taking? Authorizing Provider  albuterol (PROVENTIL HFA;VENTOLIN HFA) 108 (90 Base) MCG/ACT inhaler INHALE 2 PUFFS INTO THE LUNGS EVERY 6 HOURS AS NEEDED FOR WHEEZING OR BREATHING 02/04/18   Soyla Dryer, PA-C  albuterol (PROVENTIL) (2.5 MG/3ML) 0.083% nebulizer solution INHALE 1 VIAL VIA NEBULIZER EVERY 4 HOURS AS NEEDED FOR WHEEZING OR SHORTNESS OF BREATH 02/04/18   Soyla Dryer, PA-C  amLODipine (NORVASC) 10 MG tablet Take 1 tablet (10 mg total) by mouth daily. Patient not taking: Reported on 12/30/2017 09/30/17   Soyla Dryer, PA-C  amLODipine (NORVASC) 10 MG tablet TAKE 1 TABLET BY MOUTH ONCE DAILY 02/02/18   Soyla Dryer, PA-C  amLODipine (NORVASC) 10 MG tablet Take 1 tablet (10 mg total) by mouth daily. 02/04/18   Soyla Dryer, PA-C  Glycopyrrolate-Formoterol (BEVESPI AEROSPHERE) 9-4.8 MCG/ACT AERO Inhale 2 puffs into the lungs 2 (two) times daily. Patient not taking: Reported on 12/30/2017 10/06/17   Tanda Rockers, MD  IRON PO Take by mouth.    [provider]  mometasone-formoterol (DULERA) 200-5 MCG/ACT AERO INHALE 2 PUFFS BY MOUTH TWICE DAILY. RINSE MOUTH AFTER EACH USE 02/11/18   Soyla Dryer, PA-C  OXYGEN 2lpm when needed per pt  [facility]    [provider]  PROVENTIL HFA 108 [phone] Base) MCG/ACT inhaler INHALE 2  PUFFS BY MOUTH EVERY 6 HOURS AS NEEDED FOR COUGHING, WHEEZING, OR SHORTNESS OF BREATH Patient not taking: Reported on 12/30/2017 12/02/16   Soyla Dryer, PA-C    Physical Exam: Vitals:   03/23/18 1228 03/23/18 1228 03/23/18 1229 03/23/18 1230  BP: 108/78 135/70  121/71  Pulse: (!) 108 (!) 109 (!) 108 (!) 108  Resp: (!) 22 12 20 16   Temp:  99.6 F (37.6 C)    TempSrc:  Oral    SpO2: 100% 100% 99% 99%  Weight:      Height:        Constitutional: NAD, calm, comfortable, BiPAP in place Eyes: PERRL,  lids and conjunctivae normal ENMT: Unable to assess due to BiPAP Neck: normal, supple, no masses, no thyromegaly Respiratory: Diminished breath sounds with expiratory wheezing and scattered rhonchi.  Mild increased work of breathing Cardiovascular: Regular rhythm, mildly tachycardic, no murmurs / rubs / gallops. No extremity edema. 2+ pedal pulses. No carotid bruits.  Abdomen: no tenderness, no masses palpated. No hepatosplenomegaly. Bowel sounds positive.  Musculoskeletal: no clubbing / cyanosis. No joint deformity upper and lower extremities. Good ROM, no contractures. Normal muscle tone.  Skin: no rashes, lesions, ulcers. No induration Neurologic: CN 2-12 grossly intact. Sensation intact, DTR normal. Strength 5/5 in all 4.  Psychiatric: Unable to engage in conversation due to BiPAP   Labs on Admission: I have personally reviewed following labs and imaging studies  CBC: Recent Labs  Lab 03/23/18 0921  WBC 1.0*  NEUTROABS 0.3*  HGB 6.0*  HCT 18.6*  MCV 87.3  PLT 67*   Basic Metabolic Panel: Recent Labs  Lab 03/23/18 0921  NA 139  K 4.3  CL 100  CO2 30  GLUCOSE 138*  BUN 25*  CREATININE 1.41*  CALCIUM 9.1   GFR: Estimated Creatinine Clearance: 53 mL/min (A) (by C-G formula based on SCr of 1.41 mg/dL (H)). Liver Function Tests: No results for input(s): AST, ALT, ALKPHOS, BILITOT, PROT, ALBUMIN in the last 168 hours. No results for input(s): LIPASE, AMYLASE in the last 168 hours. No results for input(s): AMMONIA in the last 168 hours. Coagulation Profile: No results for input(s): INR, PROTIME in the last 168 hours. Cardiac Enzymes: Recent Labs  Lab 03/23/18 0921  TROPONINI 0.08*   BNP (last 3 results) No results for input(s): PROBNP in the last 8760 hours. HbA1C: No results for input(s): HGBA1C in the last 72 hours. CBG: No results for input(s): GLUCAP in the last 168 hours. Lipid Profile: No results for input(s): CHOL, HDL, LDLCALC, TRIG, CHOLHDL, LDLDIRECT  in the last 72 hours. Thyroid Function Tests: No results for input(s): TSH, T4TOTAL, FREET4, T3FREE, THYROIDAB in the last 72 hours. Anemia Panel: No results for input(s): VITAMINB12, FOLATE, FERRITIN, TIBC, IRON, RETICCTPCT in the last 72 hours. Urine analysis:    Component Value Date/Time   BILIRUBINUR NEG 09/29/2017 0928   PROTEINUR TRACE 09/29/2017 0928   UROBILINOGEN 0.2 09/29/2017 0928   NITRITE NEG 09/29/2017 0928   LEUKOCYTESUR Negative 09/29/2017 0928    Radiological Exams on Admission: Dg Chest Port 1 View  Result Date: 03/23/2018 CLINICAL DATA:  Increased shortness of breath since last night. EXAM: PORTABLE CHEST 1 VIEW COMPARISON:  One-view chest x-ray 06/21/2016 FINDINGS: The heart size is normal. The aortic arch is within normal limits. New right middle lobe airspace disease is consistent with pneumonia. Minimal left basilar atelectasis is present. IMPRESSION: 1. Right middle lobe pneumonia. 2. Minimal left basilar atelectasis. Electronically Signed   By: San Morelle  M.D.   On: 03/23/2018 10:08    EKG: Independently reviewed.  Sinus tachycardia  Assessment/Plan Active Problems:   Acute respiratory failure with hypoxia (HCC)   COPD  GOLD III    Hypertension   Anemia   Thrombocytopenia (HCC)   CAP (community acquired pneumonia)   AKI (acute kidney injury) ([place])     1. Acute respiratory failure with hypoxia.  Likely related to COPD exacerbation and pneumonia.  He is currently on BiPAP therapy.  We will continue to wean down as his respiratory status improves. 2. Community-acquired pneumonia.  Currently on ceftriaxone and azithromycin.  Urinary antigens have been ordered.  Blood cultures in process. 3. COPD exacerbation.  Continues to be short of breath and wheezing.  He received a dose of Solu-Medrol in the emergency room.  We will continue to Solu-Medrol for the 24 hours and then transition to prednisone taper. 4. Acute kidney injury.  Likely related to  dehydration.  Start on gentle IV hydration and follow. 5. Pancytopenia.  Unclear etiology.  Appears to be present for several months.  Check folate, B12.  We will also check abdominal ultrasound to evaluate liver and spleen.  He is being transfused 1 unit of PRBC.  Per ER physician, rectal exam showed yellow stool that was heme-negative.  If work-up was unrevealing in the hospital and hemoglobin is stable, will likely need outpatient follow-up with hematology.  DVT prophylaxis: SCDs Code Status: Full code Family Communication: No family present Disposition Plan: Admit to stepdown unit.  Discharge home once respiratory status has improved Consults called:   Admission status: Inpatient, stepdown  Critical care time spent: 45 minutes.  Patient remains critically ill on BiPAP therapy for respiratory failure and is at high risk for further decompensation.  He will be monitored in the stepdown unit.  Kathie Dike MD [facility] Pager [phone]  If 7PM-7AM, please contact night-coverage [email or web address] Password [facility]  03/23/2018, 12:44 PM

## 2018-03-23 NOTE — ED Notes (Signed)
Date and time results received: 03/23/18 9:56 AM  (use smartphrase ".now" to insert current time)  Test: wbc Critical Value: 1  Name of Provider Notified: Dr Thurnell Garbe   Orders Received? Or Actions Taken?:

## 2018-03-24 ENCOUNTER — Encounter (HOSPITAL_COMMUNITY): Payer: Self-pay | Admitting: Internal Medicine

## 2018-03-24 ENCOUNTER — Inpatient Hospital Stay (HOSPITAL_COMMUNITY): Payer: Medicaid Other

## 2018-03-24 DIAGNOSIS — R634 Abnormal weight loss: Secondary | ICD-10-CM

## 2018-03-24 DIAGNOSIS — Z87891 Personal history of nicotine dependence: Secondary | ICD-10-CM

## 2018-03-24 DIAGNOSIS — J449 Chronic obstructive pulmonary disease, unspecified: Secondary | ICD-10-CM

## 2018-03-24 DIAGNOSIS — D61818 Other pancytopenia: Secondary | ICD-10-CM

## 2018-03-24 DIAGNOSIS — G629 Polyneuropathy, unspecified: Secondary | ICD-10-CM

## 2018-03-24 DIAGNOSIS — J189 Pneumonia, unspecified organism: Principal | ICD-10-CM

## 2018-03-24 LAB — CBC
HCT: 25.3 % — ABNORMAL LOW (ref 39.0–52.0)
HEMOGLOBIN: 8.2 g/dL — AB (ref 13.0–17.0)
MCH: 28.2 pg (ref 26.0–34.0)
MCHC: 32.4 g/dL (ref 30.0–36.0)
MCV: 86.9 fL (ref 80.0–100.0)
Platelets: 53 10*3/uL — ABNORMAL LOW (ref 150–400)
RBC: 2.91 MIL/uL — AB (ref 4.22–5.81)
RDW: 17.4 % — ABNORMAL HIGH (ref 11.5–15.5)
WBC: 0.4 10*3/uL — AB (ref 4.0–10.5)
nRBC: 9.8 % — ABNORMAL HIGH (ref 0.0–0.2)

## 2018-03-24 LAB — CBC WITH DIFFERENTIAL/PLATELET
ABS IMMATURE GRANULOCYTES: 0.01 10*3/uL (ref 0.00–0.07)
Basophils Absolute: 0 10*3/uL (ref 0.0–0.1)
Basophils Relative: 0 %
EOS ABS: 0 10*3/uL (ref 0.0–0.5)
Eosinophils Relative: 0 %
HCT: 18.6 % — ABNORMAL LOW (ref 39.0–52.0)
Hemoglobin: 5.9 g/dL — CL (ref 13.0–17.0)
Immature Granulocytes: 4 %
LYMPHS ABS: 0.1 10*3/uL — AB (ref 0.7–4.0)
Lymphocytes Relative: 39 %
MCH: 28 pg (ref 26.0–34.0)
MCHC: 31.7 g/dL (ref 30.0–36.0)
MCV: 88.2 fL (ref 80.0–100.0)
MONOS PCT: 8 %
Monocytes Absolute: 0 10*3/uL — ABNORMAL LOW (ref 0.1–1.0)
NEUTROS ABS: 0.1 10*3/uL — AB (ref 1.7–7.7)
NEUTROS PCT: 49 %
NRBC: 11.5 % — AB (ref 0.0–0.2)
Platelets: 46 10*3/uL — ABNORMAL LOW (ref 150–400)
RBC: 2.11 MIL/uL — ABNORMAL LOW (ref 4.22–5.81)
RDW: 18.7 % — ABNORMAL HIGH (ref 11.5–15.5)
WBC: 0.3 10*3/uL — CL (ref 4.0–10.5)

## 2018-03-24 LAB — PREPARE RBC (CROSSMATCH)

## 2018-03-24 LAB — BASIC METABOLIC PANEL
Anion gap: 6 (ref 5–15)
BUN: 22 mg/dL (ref 8–23)
CHLORIDE: 103 mmol/L (ref 98–111)
CO2: 28 mmol/L (ref 22–32)
CREATININE: 0.94 mg/dL (ref 0.61–1.24)
Calcium: 8.6 mg/dL — ABNORMAL LOW (ref 8.9–10.3)
GFR calc Af Amer: >60 mL/min (ref >60)
GFR calc non Af Amer: >60 mL/min (ref >60)
Glucose, Bld: 169 mg/dL — ABNORMAL HIGH (ref 70–99)
Potassium: 5.6 mmol/L — ABNORMAL HIGH (ref 3.5–5.1)
Sodium: 137 mmol/L (ref 135–145)

## 2018-03-24 LAB — LACTATE DEHYDROGENASE: LDH: 423 U/L — ABNORMAL HIGH (ref 98–192)

## 2018-03-24 LAB — HIV ANTIBODY (ROUTINE TESTING W REFLEX): HIV Screen 4th Generation wRfx: NONREACTIVE

## 2018-03-24 LAB — TROPONIN I: Troponin I: <0.03 ng/mL (ref <0.03)

## 2018-03-24 MED ORDER — METHYLPREDNISOLONE SODIUM SUCC 125 MG IJ SOLR
60.0000 mg | Freq: Four times a day (QID) | INTRAMUSCULAR | Status: DC
  Administered 2018-03-24 – 2018-03-27 (×11): 60 mg via INTRAVENOUS
  Filled 2018-03-24 (×11): qty 2

## 2018-03-24 MED ORDER — FUROSEMIDE 10 MG/ML IJ SOLN
20.0000 mg | Freq: Once | INTRAMUSCULAR | Status: AC
  Administered 2018-03-24: 20 mg via INTRAVENOUS
  Filled 2018-03-24: qty 2

## 2018-03-24 MED ORDER — SODIUM CHLORIDE 0.9% IV SOLUTION
Freq: Once | INTRAVENOUS | Status: AC
  Administered 2018-03-24: 06:00:00 via INTRAVENOUS

## 2018-03-24 MED ORDER — ACETAMINOPHEN 325 MG PO TABS
650.0000 mg | ORAL_TABLET | Freq: Once | ORAL | Status: AC
  Administered 2018-03-24: 650 mg via ORAL
  Filled 2018-03-24: qty 2

## 2018-03-24 MED ORDER — DIPHENHYDRAMINE HCL 50 MG/ML IJ SOLN
25.0000 mg | Freq: Once | INTRAMUSCULAR | Status: AC
  Administered 2018-03-24: 25 mg via INTRAVENOUS
  Filled 2018-03-24: qty 1

## 2018-03-24 MED ORDER — ENSURE ENLIVE PO LIQD
237.0000 mL | Freq: Two times a day (BID) | ORAL | Status: DC
  Administered 2018-03-25 – 2018-04-02 (×11): 237 mL via ORAL

## 2018-03-24 NOTE — Care Management Note (Signed)
Case Management Note  Patient Details  Name: KHALEL ALMS MRN: [id number] Date of Birth: Feb 02, 1956  Subjective/Objective:   COPD. Chart reviewed.  From home, lives with mother.Follows at [facility]. Requiring Bipap. Has neb machine at home. PT/OT evals pending.               Action/Plan: CM following for needs.   Expected Discharge Date:     unk             Expected Discharge Plan:     In-House Referral:     Discharge planning Services  CM Consult  Post Acute Care Choice:    Choice offered to:     DME Arranged:    DME Agency:     HH Arranged:    HH Agency:     Status of Service:  In process, will continue to follow  If discussed at Long Length of Stay Meetings, dates discussed:    Additional Comments:  Jaynee Winters, Chauncey [place], RN 03/24/2018, 1:05 PM

## 2018-03-24 NOTE — Consult Note (Signed)
[facility] Consultation Oncology  Name: Marvin Davis      MRN: [id number]    Location: IC07/IC07-01  Date: 03/24/2018 Time:5:19 PM   REFERRING PHYSICIAN: Dr. 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  HISTORY OF PRESENT ILLNESS: Mr. Marvin Davis is a 62 year old very pleasant African-American male who is seen in consultation today for further work-up and management of severe pancytopenia.  He presented to the hospital with severe shortness of breath and was diagnosed with right middle lobe pneumonia.  His white count on presentation was 1.5 with a platelet count of 67.  His hemoglobin was 6 on presentation and he received 3 units of blood transfusion.  There were nucleated red blood cells on the smear.  I have reviewed his blood counts from August when the white count was low at 1.5 and platelet count at 61.  Prior to that in January he had a normal white count and platelet count.  He had been mildly anemic in the past.  He reports about 20 pound weight loss in the last 2 months.  He denies any fevers or night sweats.  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  He smoked more than 1 pack/day for several years and quit 3 years ago.  He worked in [employer] and had lots of exposure to asbestos and other chemicals like lead.  On admission his ferritin and iron panel were within normal limits.  He had a normal folate level.  His B12 was borderline at 287.  His creatinine was also elevated at 1.41, which improved to 0.94 after hydration.  Overall he has seen some improvement in his breathing.  PAST MEDICAL HISTORY:   Past Medical History:  Diagnosis Date  . Anemia   . COPD (chronic obstructive pulmonary disease) ([place])   . On home O2    2L N/C  . Peripheral neuropathy   . Thrombocytopenia ([place])     ALLERGIES: No Known Allergies    MEDICATIONS: I have reviewed the patient's current medications.     PAST  SURGICAL HISTORY History reviewed. No pertinent surgical history.  FAMILY HISTORY: Family History  Problem Relation Age of Onset  . Hypertension Father   . Stroke Father   . Diabetes Brother     SOCIAL HISTORY:  reports that he quit smoking about 20 months ago. His smoking use included cigarettes. He has a 69.00 pack-year smoking history. He has never used smokeless tobacco. He reports that he drinks alcohol. He reports that he does not use drugs.  PERFORMANCE STATUS: The patient's performance status is 2 - Symptomatic, <50% confined to bed  PHYSICAL EXAM: Most Recent Vital Signs: Blood pressure (!) 156/86, pulse 100, temperature 98.5 F (36.9 C), temperature source Oral, resp. rate (!) 32, height _0  (1.651 m), weight 178 lb 2.1 oz (80.8 kg), SpO2 (!) 88 %. BP (!) 156/86   Pulse 100   Temp 98.5 F (36.9 C) (Oral)   Resp (!) 32   Ht _1  (1.651 m)   Wt 178 lb 2.1 oz (80.8 kg)   SpO2 (!) 88%   BMI 29.64 kg/m  General appearance: alert and cooperative Lungs: diminished breath sounds bilaterally and rhonchi bilaterally Heart: regular rate and rhythm Abdomen: soft, non-tender; bowel sounds normal; no masses,  no organomegaly Extremities: extremities normal, atraumatic, no cyanosis or edema Skin: Skin color, texture, turgor normal. No rashes or lesions Lymph nodes: Cervical, supraclavicular, and axillary  nodes normal. Neurologic: Grossly normal  LABORATORY DATA:  Results for orders placed or performed during the hospital encounter of 03/23/18 (from the past 48 hour(s))  Basic metabolic panel     Status: Abnormal   Collection Time: 03/23/18  9:21 AM  Result Value Ref Range   Sodium 139 135 - 145 mmol/L   Potassium 4.3 3.5 - 5.1 mmol/L   Chloride 100 98 - 111 mmol/L   CO2 30 22 - 32 mmol/L   Glucose, Bld 138 (H) 70 - 99 mg/dL   BUN 25 (H) 8 - 23 mg/dL   Creatinine, Ser 1.41 (H) 0.61 - 1.24 mg/dL   Calcium 9.1 8.9 - 10.3 mg/dL   GFR calc non Af Amer 52 (L) >60 mL/min    GFR calc Af Amer 60 (L) >60 mL/min    Comment: (NOTE) The eGFR has been calculated using the CKD EPI equation. This calculation has not been validated in all clinical situations. eGFR's persistently <60 mL/min signify possible Chronic Kidney Disease.    Anion gap 9 5 - 15    Comment: Performed at [facility], [address], [place], [place] [address]  CBC     Status: Abnormal   Collection Time: 03/23/18  9:21 AM  Result Value Ref Range   WBC 1.0 (LL) 4.0 - 10.5 K/uL    Comment: WHITE COUNT CONFIRMED ON SMEAR THIS CRITICAL RESULT HAS VERIFIED AND BEEN CALLED TO [place] BY HILLARY FLYNT ON 10 28 2019 AT 0955, AND HAS BEEN READ BACK. CRITICAL RESULT VERIFIED    RBC 2.13 (L) 4.22 - 5.81 MIL/uL   Hemoglobin 6.0 (LL) 13.0 - 17.0 g/dL    Comment: REPEATED TO VERIFY THIS CRITICAL RESULT HAS VERIFIED AND BEEN CALLED TO [place] BY HILLARY FLYNT ON 10 28 2019 AT 0955, AND HAS BEEN READ BACK. CRITICAL RESULT VERIFIED    HCT 18.6 (L) 39.0 - 52.0 %   MCV 87.3 80.0 - 100.0 fL   MCH 28.2 26.0 - 34.0 pg   MCHC 32.3 30.0 - 36.0 g/dL   RDW 19.9 (H) 11.5 - 15.5 %   Platelets 67 (L) 150 - 400 K/uL    Comment: PLATELET COUNT CONFIRMED BY SMEAR SPECIMEN CHECKED FOR CLOTS Immature Platelet Fraction may be clinically indicated, consider ordering this additional test [id number]    nRBC 2.0 (H) 0.0 - 0.2 %    Comment: Performed at [facility], [address] [place], [place] [address]  Troponin I     Status: Abnormal   Collection Time: 03/23/18  9:21 AM  Result Value Ref Range   Troponin I 0.08 (HH) <0.03 ng/mL    Comment: CRITICAL RESULT CALLED TO, READ BACK BY AND VERIFIED WITH: Daisy Lazar AT 10:10AM ON 03/23/18 BY Derrill Memo Performed at [facility], [address], [place],  [address]   Differential     Status: Abnormal   Collection Time: 03/23/18  9:21 AM  Result Value Ref Range   Neutrophils Relative % 27 %   Neutro Abs 0.3 (L) 1.7 - 7.7 K/uL   Lymphocytes Relative 58 %   Lymphs  Abs 0.6 (L) 0.7 - 4.0 K/uL   Monocytes Relative 12 %   Monocytes Absolute 0.1 0.1 - 1.0 K/uL   Eosinophils Relative 1 %   Eosinophils Absolute 0.0 0.0 - 0.5 K/uL   Basophils Relative 0 %   Basophils Absolute 0.0 0.0 - 0.1 K/uL   WBC Morphology HIDE    RBC Morphology See Note     Comment:  MACROCYCTOSIS   Smear Review HIDE    nRBC 3 (H) 0 /100 WBC    Comment: Performed at [facility], [address], [place], [place] [address]  ABO/Rh     Status: None   Collection Time: 03/23/18 10:01 AM  Result Value Ref Range   ABO/RH(D)      O POS Performed at [facility], [address], [place], [place] [address]   Type and screen     Status: None (Preliminary result)   Collection Time: 03/23/18 10:05 AM  Result Value Ref Range   ABO/RH(D) O POS    Antibody Screen NEG    Sample Expiration 03/26/2018    Unit Number [id number]    Blood Component Type RBC LR PHER1    Unit division 00    Status of Unit ALLOCATED    Transfusion Status OK TO TRANSFUSE    Crossmatch Result Compatible    Unit Number [id number]    Blood Component Type RBC LR PHER2    Unit division 00    Status of Unit ISSUED,FINAL    Transfusion Status OK TO TRANSFUSE    Crossmatch Result      Compatible Performed at [facility], [address] [place], [place] [address]    Unit Number [id number]    Blood Component Type RBC LR PHER2    Unit division 00    Status of Unit ISSUED    Transfusion Status OK TO TRANSFUSE    Crossmatch Result Compatible    Unit Number [id number]    Blood Component Type RED CELLS,LR    Unit division 00    Status of Unit ISSUED    Transfusion Status OK TO TRANSFUSE    Crossmatch Result Compatible   Prepare RBC     Status: None   Collection Time: 03/23/18 10:05 AM  Result Value Ref Range   Order Confirmation      ORDER PROCESSED BY BLOOD BANK Performed at [facility], [address], [place], [place] [address]   POC occult blood, ED     Status: None   Collection Time: 03/23/18  10:56 AM  Result Value Ref Range   Fecal Occult Bld NEGATIVE NEGATIVE  Culture, blood (routine x 2) Call MD if unable to obtain prior to antibiotics being given     Status: None (Preliminary result)   Collection Time: 03/23/18 10:56 AM  Result Value Ref Range   Specimen Description BLOOD RIGHT ARM    Special Requests      BOTTLES DRAWN AEROBIC ONLY Blood Culture adequate volume   Culture      NO GROWTH < 24 HOURS Performed at [facility], [address], [place], [place] [address]    Report Status PENDING   Culture, blood (routine x 2) Call MD if unable to obtain prior to antibiotics being given     Status: None (Preliminary result)   Collection Time: 03/23/18 11:10 AM  Result Value Ref Range   Specimen Description BLOOD RIGHT FOREARM    Special Requests      BOTTLES DRAWN AEROBIC AND ANAEROBIC Blood Culture adequate volume   Culture      NO GROWTH < 24 HOURS Performed at [facility], [address] [place], [place] [address]    Report Status PENDING   MRSA PCR Screening     Status: None   Collection Time: 03/23/18  2:25 PM  Result Value Ref Range   MRSA by PCR NEGATIVE NEGATIVE    Comment:  The GeneXpert MRSA Assay (FDA approved for NASAL specimens only), is one component of a comprehensive MRSA colonization surveillance program. It is not intended to diagnose MRSA infection nor to guide or monitor treatment for MRSA infections. Performed at [facility], [address], [place], [place] [address]   HIV antibody (Routine Screening)     Status: None   Collection Time: 03/23/18  3:20 PM  Result Value Ref Range   HIV Screen 4th Generation wRfx Non Reactive Non Reactive    Comment: (NOTE) Performed At: [facility] [place], [place] [id number] Rush Farmer MD MH:[id number]   CBC     Status: Abnormal   Collection Time: 03/23/18  3:20 PM  Result Value Ref Range   WBC 0.4 (LL) 4.0 - 10.5 K/uL    Comment: REPEATED TO VERIFY WHITE COUNT  CONFIRMED ON SMEAR THIS CRITICAL RESULT HAS VERIFIED AND BEEN CALLED TO [place] BY HILLARY FLYNT ON 10 28 2019 AT 1608, AND HAS BEEN READ BACK. CRITICAL RESULT VERIFIED    RBC 2.31 (L) 4.22 - 5.81 MIL/uL   Hemoglobin 6.5 (LL) 13.0 - 17.0 g/dL    Comment: REPEATED TO VERIFY THIS CRITICAL RESULT HAS VERIFIED AND BEEN CALLED TO [place] BY HILLARY FLYNT ON 10 28 2019 AT 1608, AND HAS BEEN READ BACK. CRITICAL RESULT VERIFIED    HCT 20.3 (L) 39.0 - 52.0 %   MCV 87.9 80.0 - 100.0 fL   MCH 28.1 26.0 - 34.0 pg   MCHC 32.0 30.0 - 36.0 g/dL   RDW 18.6 (H) 11.5 - 15.5 %   Platelets 49 (L) 150 - 400 K/uL    Comment: PLATELET COUNT CONFIRMED BY SMEAR SPECIMEN CHECKED FOR CLOTS Immature Platelet Fraction may be clinically indicated, consider ordering this additional test [id number]    nRBC 5.7 (H) 0.0 - 0.2 %    Comment: Performed at [facility], [address], [place], [place] [address]  Creatinine, serum     Status: None   Collection Time: 03/23/18  3:20 PM  Result Value Ref Range   Creatinine, Ser 1.19 0.61 - 1.24 mg/dL   GFR calc non Af Amer >60 >60 mL/min   GFR calc Af Amer >60 >60 mL/min    Comment: (NOTE) The eGFR has been calculated using the CKD EPI equation. This calculation has not been validated in all clinical situations. eGFR's persistently <60 mL/min signify possible Chronic Kidney Disease. Performed at [facility], [address] [place], [place] [address]   Vitamin B12     Status: None   Collection Time: 03/23/18  3:20 PM  Result Value Ref Range   Vitamin B-12 287 180 - 914 pg/mL    Comment: (NOTE) This assay is not validated for testing neonatal or myeloproliferative syndrome specimens for Vitamin B12 levels. Performed at [facility], [address], [place], [place] [address]   Folate     Status: None   Collection Time: 03/23/18  3:20 PM  Result Value Ref Range   Folate 13.8 >5.9 ng/mL    Comment: Performed at [facility], [address], [place], [place]  [address]  Iron and TIBC     Status: Abnormal   Collection Time: 03/23/18  3:20 PM  Result Value Ref Range   Iron 63 45 - 182 ug/dL   TIBC 159 (L) 250 - 450 ug/dL   Saturation Ratios 40 (H) 17.9 - 39.5 %   UIBC 96 ug/dL    Comment: Performed at [facility], [place]  [address] [place], [place] [address]  Ferritin     Status: Abnormal   Collection Time: 03/23/18  3:20 PM  Result Value Ref Range   Ferritin 2,135 (H) 24 - 336 ng/mL    Comment: Performed at [facility], [address], [place], [place] [address]  Reticulocytes     Status: Abnormal   Collection Time: 03/23/18  3:20 PM  Result Value Ref Range   Retic Ct Pct 0.7 0.4 - 3.1 %   RBC. 2.31 (L) 4.22 - 5.81 MIL/uL   Retic Count, Absolute 16.4 (L) 19.0 - 186.0 K/uL   Immature Retic Fract 22.6 (H) 2.3 - 15.9 %    Comment: Performed at [facility], [address] [place], [place] [address]  Troponin I (q 6hr x 3)     Status: None   Collection Time: 03/23/18  3:20 PM  Result Value Ref Range   Troponin I <0.03 <0.03 ng/mL    Comment: Performed at [facility], [address], [place], [place] [address]  Troponin I (q 6hr x 3)     Status: None   Collection Time: 03/23/18  7:52 PM  Result Value Ref Range   Troponin I <0.03 <0.03 ng/mL    Comment: Performed at [facility], [address] [place], [place] [address]  Troponin I (q 6hr x 3)     Status: None   Collection Time: 03/24/18  2:20 AM  Result Value Ref Range   Troponin I <0.03 <0.03 ng/mL    Comment: Performed at [facility], [address] [place], [place] [address]  Basic metabolic panel     Status: Abnormal   Collection Time: 03/24/18  2:20 AM  Result Value Ref Range   Sodium 137 135 - 145 mmol/L   Potassium 5.6 (H) 3.5 - 5.1 mmol/L    Comment: DELTA CHECK NOTED   Chloride 103 98 - 111 mmol/L   CO2 28 22 - 32 mmol/L   Glucose, Bld 169 (H) 70 - 99 mg/dL   BUN 22 8 - 23 mg/dL   Creatinine, Ser 0.94 0.61 - 1.24 mg/dL   Calcium 8.6 (L) 8.9 - 10.3 mg/dL   GFR calc non Af  Amer >60 >60 mL/min   GFR calc Af Amer >60 >60 mL/min    Comment: (NOTE) The eGFR has been calculated using the CKD EPI equation. This calculation has not been validated in all clinical situations. eGFR's persistently <60 mL/min signify possible Chronic Kidney Disease.    Anion gap 6 5 - 15    Comment: Performed at [facility], [address] [place], [place] [address]  CBC WITH DIFFERENTIAL     Status: Abnormal   Collection Time: 03/24/18  2:20 AM  Result Value Ref Range   WBC 0.3 (LL) 4.0 - 10.5 K/uL    Comment: REPEATED TO VERIFY WHITE COUNT CONFIRMED ON SMEAR CRITICIAL RESULTS CALLED TO HEARN,J_0  BY MATTHEWS, B 10.29.19 THIS CRITICAL RESULT HAS VERIFIED AND BEEN CALLED TO HEARN,J BY BOBBIE MATTHEWS ON 10 29 2019 AT 0315, AND HAS BEEN READ BACK. CRITICAL RESULTS CALLED @ 0248 BY MATTHEWS, B 10.29.19 TO HEARN,J    RBC 2.11 (L) 4.22 - 5.81 MIL/uL   Hemoglobin 5.9 (LL) 13.0 - 17.0 g/dL    Comment: REPEATED TO VERIFY CRITICIAL RESULTS CALLED TO HEARN,J_1  BY MATTHEWS, B 10.29.19 THIS CRITICAL RESULT HAS VERIFIED AND BEEN CALLED TO HEARN,J BY BOBBIE MATTHEWS ON 10 29 2019 AT 0315, AND HAS BEEN READ BACK. CRITICAL RESULTS CALLED @ 0248 BY  MATTHEWS, B 10.29.19 TO HEARN,J    HCT 18.6 (L) 39.0 - 52.0 %   MCV 88.2 80.0 - 100.0 fL   MCH 28.0 26.0 - 34.0 pg   MCHC 31.7 30.0 - 36.0 g/dL   RDW 18.7 (H) 11.5 - 15.5 %   Platelets 46 (L) 150 - 400 K/uL    Comment: PLATELET COUNT CONFIRMED BY SMEAR SPECIMEN CHECKED FOR CLOTS Immature Platelet Fraction may be clinically indicated, consider ordering this additional test [id number] CRITICAL REPEATED TO VERIFY. CRITICIAL RESULTS CALLED TO HEARN,J_0  BY MATTHEWS, B 10.29.19    nRBC 11.5 (H) 0.0 - 0.2 %   Neutrophils Relative % 49 %   Neutro Abs 0.1 (L) 1.7 - 7.7 K/uL    Comment: This critical result has verified and been called to HEARN,J by Lorette Ang on 10 29 2019 at [place], and has been read back.    Lymphocytes Relative 39 %    Lymphs Abs 0.1 (L) 0.7 - 4.0 K/uL   Monocytes Relative 8 %   Monocytes Absolute 0.0 (L) 0.1 - 1.0 K/uL   Eosinophils Relative 0 %   Eosinophils Absolute 0.0 0.0 - 0.5 K/uL   Basophils Relative 0 %   Basophils Absolute 0.0 0.0 - 0.1 K/uL   RBC Morphology See Note     Comment: MACROCYTES PRESENT NUCLEATED RBC PRESENT   Smear Review PENDING PATHOLOGIST REVIEW    Immature Granulocytes 4 %   Abs Immature Granulocytes 0.01 0.00 - 0.07 K/uL   Polychromasia PRESENT    Target Cells PRESENT    Giant PLTs PRESENT     Comment: Performed at [facility], [address] [place], [place] [address]  Prepare RBC     Status: None   Collection Time: 03/24/18  3:09 AM  Result Value Ref Range   Order Confirmation      ORDER PROCESSED BY BLOOD BANK Performed at [facility], [address] [place], [place] [address]   CBC     Status: Abnormal   Collection Time: 03/24/18  2:34 PM  Result Value Ref Range   WBC 0.4 (LL) 4.0 - 10.5 K/uL    Comment: REPEATED TO VERIFY THIS CRITICAL RESULT HAS VERIFIED AND BEEN CALLED TO EMURPHY BY HILLARY FLYNT ON 10 29 2019 AT 1500, AND HAS BEEN READ BACK. CRITICAL RESULT VERIFIED    RBC 2.91 (L) 4.22 - 5.81 MIL/uL   Hemoglobin 8.2 (L) 13.0 - 17.0 g/dL    Comment: REPEATED TO VERIFY POST TRANSFUSION SPECIMEN    HCT 25.3 (L) 39.0 - 52.0 %   MCV 86.9 80.0 - 100.0 fL   MCH 28.2 26.0 - 34.0 pg   MCHC 32.4 30.0 - 36.0 g/dL   RDW 17.4 (H) 11.5 - 15.5 %   Platelets 53 (L) 150 - 400 K/uL    Comment: SPECIMEN CHECKED FOR CLOTS Immature Platelet Fraction may be clinically indicated, consider ordering this additional test [id number] CONSISTENT WITH PREVIOUS RESULT    nRBC 9.8 (H) 0.0 - 0.2 %    Comment: Performed at [facility], [address], [place], [place] [address]      RADIOGRAPHY: US Abdomen Complete  Result Date: 03/24/2018 CLINICAL DATA:  Thrombocytopenia, anemia, acute kidney injury. EXAM: ABDOMEN ULTRASOUND COMPLETE COMPARISON:  None. FINDINGS:  Gallbladder: No gallstones or wall thickening visualized. No sonographic Murphy sign noted by sonographer. Common bile duct: Diameter: 3 mm Liver: No focal lesion identified. Within normal limits in parenchymal echogenicity. Portal vein is patent on color Doppler imaging with  normal direction of blood flow towards the liver. IVC: No abnormality visualized. Pancreas: Bowel gas limits evaluation of the pancreatic head and tail. The pancreatic body is grossly normal. Spleen: Size and appearance within normal limits. Right Kidney: Length: 10.1 cm. The renal cortical echotexture remains lower than that of the liver. There is no hydronephrosis. An echogenic focus in the upper pole of the right kidney measures 9 mm but exhibits no distal shadowing. This may reflect an angiomyolipoma. Left Kidney: Length: 11.1 cm. The renal cortical echotexture is similar to that on the right. Exophytic from the midpole there is an abnormal complex appearing mass measuring 10.5 x 7 x 6.6 cm. There is no hydronephrosis. Abdominal aorta: No aneurysm visualized. Other findings: There is a right pleural effusion. IMPRESSION: Abnormal appearance of the left kidney with a complex mass arising from the midpole suspicious for malignancy. Renal protocol MRI or triphasic renal CT scanning is recommended. 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 Normal appearance of the hepatobiliary tree with the exception of limited visualization of the pancreas. Electronically Signed   By: David  Martinique M.D.   On: 03/24/2018 13:26   Dg Chest Port 1 View  Result Date: 03/23/2018 CLINICAL DATA:  Increased shortness of breath since last night. EXAM: PORTABLE CHEST 1 VIEW COMPARISON:  One-view chest x-ray 06/21/2016 FINDINGS: The heart size is normal. The aortic arch is within normal limits. New right middle lobe airspace disease is consistent with pneumonia. Minimal left basilar atelectasis is present. IMPRESSION: 1. Right  middle lobe pneumonia. 2. Minimal left basilar atelectasis. Electronically Signed   By: San Morelle M.D.   On: 03/23/2018 10:08       ASSESSMENT and PLAN:  1.  Pancytopenia: - White count on presentation was 1.0, platelet count of 67 and hemoglobin of 6. - Had low white count of 1.5 in August with a low platelet count of 61 and anemia.  His white count in May was 2.7 with a normal platelet count of 151. - Reports 20 pound weight loss in the last 2 months, denies any fevers or night sweats.  No recent infections in the last 6 months prior to this admission. - Lot of nucleated RBC on the smear, increasing the chance of a myelophthisic process. - We will check an LDH level.  We will check methylmalonic acid as his B12 was borderline at 287.  Reticulocyte count was low, less than 1%. - Because of his pancytopenia and nucleated red blood cells, I have recommended bone marrow aspiration and biopsy to rule out bone marrow infiltrative process like MDS, acute leukemia or lymphoma.  I have talked to Dr. Roderic Palau about it.  He will likely schedule this through interventional radiology.  If not I plan to do it by coordinating with the pathology technician.     All questions were answered. The patient knows to call the clinic with any problems, questions or concerns. We can certainly see the patient much sooner if necessary.    Derek Jack

## 2018-03-24 NOTE — Progress Notes (Addendum)
Initial Nutrition Assessment  DOCUMENTATION CODES:   Not applicable  INTERVENTION:  Heart Healthy diet  Ensure Enlive po BID, each supplement provides 350 kcal and 20 grams of protein    NUTRITION DIAGNOSIS:   Increased nutrient needs related to chronic illness(COPD chronic O2 at home- 2 liters) as evidenced by estimated needs, per patient/family report.   GOAL:   Patient will meet greater than or equal to 90% of their needs  MONITOR:   PO intake, Supplement acceptance, Weight trends, Labs  REASON FOR ASSESSMENT:  Consult, Malnutrition Screening Tool Assessment of nutrition requirement/status  ASSESSMENT: Patient has hx of  COPD on 2 Liters O2 at home. Requiring BiPAP support currently. Short of breath when conversing.   Patient and his mom live together. He follows a regular diet and primarily drinks orange juice and sweet tea.  His intake poor the past 3-4 days prior to admission. Today he ate well 100% this morning and says he is feeling better now that his breathing is improved. He is able to prepare his meals at home and shop for food as needed.   Patient weight is down 9 lb (4 kg)- 4.9% the past 80 days. Not significant for time elasped.   Medications reviewed and include: prednisone, Norvasc, rocephin and Zithromax.  Labs: hyperkalemia BMP Latest Ref Rng & Units 03/24/2018 03/23/2018 03/23/2018  Glucose 70 - 99 mg/dL 169(H) - 138(H)  BUN 8 - 23 mg/dL 22 - 25(H)  Creatinine 0.61 - 1.24 mg/dL 0.94 1.19 1.41(H)  Sodium 135 - 145 mmol/L 137 - 139  Potassium 3.5 - 5.1 mmol/L 5.6(H) - 4.3  Chloride 98 - 111 mmol/L 103 - 100  CO2 22 - 32 mmol/L 28 - 30  Calcium 8.9 - 10.3 mg/dL 8.6(L) - 9.1     Diet Order:   Diet Order            Diet Heart Room service appropriate? Yes; Fluid consistency: Thin  Diet effective now              EDUCATION NEEDS:   No education needs have been identified at this time   Skin:  Skin Assessment: Reviewed RN Assessment  Last  BM:  10/24  Height:   Ht Readings from Last 1 Encounters:  03/23/18 5\' 5"  (1.651 m)    Weight:   Wt Readings from Last 1 Encounters:  03/24/18 80.8 kg    Ideal Body Weight:  62 kg  BMI:  Body mass index is 29.64 kg/m.  Estimated Nutritional Needs:   Kcal:  5916-3846   Protein:  97-105 gr  Fluid:  >2 liters daily   Colman Cater MS,RD,CSG,LDN Office: #[phone] Pager: [phone]

## 2018-03-24 NOTE — Progress Notes (Signed)
OT Cancellation Note  Patient Details Name: Marvin Davis MRN: [id number] DOB: 1955-09-01   Cancelled Treatment:    Reason Eval/Treat Not Completed: Medical issues which prohibited therapy. Pt on BiPAP this am, will check back at a later time when pt able to fully participate in evaluation.    Guadelupe Sabin, OTR/L  [phone] 03/24/2018, 9:08 AM

## 2018-03-24 NOTE — Evaluation (Signed)
Physical Therapy Evaluation Patient Details Name: Marvin Davis MRN: [id number] DOB: 11-Jun-1955 Today's Date: 03/24/2018   History of Present Illness  BOHDEN Davis is a 62 y.o. male with medical history significant of COPD.  History is somewhat limited since he is currently on BiPAP and no family is present to offer further history.  Per medical record, patient has been short of breath for the past month.  He had increased wheezing that has not improved with his nebulizer treatments.  Over the past few days he had a worsening cough.  He describes a tightness across his chest.  No reported fever, nausea, vomiting or diarrhea    Clinical Impression  Patient functioning near baseline for functional mobility and gait, other mostly limited due to SOB, able to transfer to sit up at bedside and transfer to chair without hands on assist and tolerated sitting up in chair after therapy.  Patient on 4 LPM with O2 saturation dropping to 88% with exertion.  Patient will benefit from continued physical therapy in hospital and recommended venue below to increase strength, balance, endurance for safe ADLs and gait.    Follow Up Recommendations Home health PT;Supervision - Intermittent    Equipment Recommendations  None recommended by PT    Recommendations for Other Services       Precautions / Restrictions Precautions Precautions: None Precaution Comments: monitor O2 saturations, Home O2 dependent Restrictions Weight Bearing Restrictions: No      Mobility  Bed Mobility Overal bed mobility: Modified Independent             General bed mobility comments: head of bed raised  Transfers Overall transfer level: Needs assistance Equipment used: None Transfers: Sit to/from Stand;Stand Pivot Transfers Sit to Stand: Supervision Stand pivot transfers: Supervision          Ambulation/Gait Ambulation/Gait assistance: Supervision Gait Distance (Feet): 4 Feet Assistive device:  None Gait Pattern/deviations: Decreased step length - right;Decreased step length - left;Decreased stride length Gait velocity: decreased   General Gait Details: limited to 5-6 steps at bedside during transfer to chair moslty due to SOB  Stairs            Wheelchair Mobility    Modified Rankin (Stroke Patients Only)       Balance Overall balance assessment: Mild deficits observed, not formally tested                                           Pertinent Vitals/Pain Pain Assessment: No/denies pain    Home Living Family/patient expects to be discharged to:: Private residence Living Arrangements: Parent   Type of Home: Mobile home Home Access: Stairs to enter Entrance Stairs-Rails: Right;Left;Can reach both Entrance Stairs-Number of Steps: 4 Home Layout: One level Home Equipment: Walker - 4 wheels;Cane - single point;Shower seat;Toilet riser      Prior Function Level of Independence: Independent with assistive device(s)         Comments: household and short distance communty with Rollator or SPC     Hand Dominance        Extremity/Trunk Assessment   Upper Extremity Assessment Upper Extremity Assessment: Overall WFL for tasks assessed    Lower Extremity Assessment Lower Extremity Assessment: Generalized weakness    Cervical / Trunk Assessment Cervical / Trunk Assessment: Normal  Communication   Communication: No difficulties  Cognition Arousal/Alertness: Awake/alert Behavior During Therapy: WFL for  tasks assessed/performed Overall Cognitive Status: Within Functional Limits for tasks assessed                                        General Comments      Exercises     Assessment/Plan    PT Assessment Patient needs continued PT services  PT Problem List Decreased strength;Decreased activity tolerance;Decreased balance;Decreased mobility;Cardiopulmonary status limiting activity       PT Treatment  Interventions Gait training;Stair training;Functional mobility training;Therapeutic activities;Therapeutic exercise;Patient/family education    PT Goals (Current goals can be found in the Care Plan section)  Acute Rehab PT Goals Patient Stated Goal: return home with family to assist PT Goal Formulation: With patient Time For Goal Achievement: 03/31/18 Potential to Achieve Goals: Good    Frequency Min 3X/week   Barriers to discharge        Co-evaluation               AM-PAC PT "6 Clicks" Daily Activity  Outcome Measure Difficulty turning over in bed (including adjusting bedclothes, sheets and blankets)?: None Difficulty moving from lying on back to sitting on the side of the bed? : None Difficulty sitting down on and standing up from a chair with arms (e.g., wheelchair, bedside commode, etc,.)?: A Little Help needed moving to and from a bed to chair (including a wheelchair)?: A Little Help needed walking in hospital room?: A Little Help needed climbing 3-5 steps with a railing? : A Little 6 Click Score: 20    End of Session   Activity Tolerance: Patient tolerated treatment well;Patient limited by fatigue(mostly limited by SOB) Patient left: in chair;with call bell/phone within reach Nurse Communication: Mobility status PT Visit Diagnosis: Unsteadiness on feet (R26.81);Other abnormalities of gait and mobility (R26.89);Muscle weakness (generalized) (M62.81)    Time: 8280-0349 PT Time Calculation (min) (ACUTE ONLY): 27 min   Charges:   PT Evaluation $PT Eval Moderate Complexity: 1 Mod PT Treatments $Therapeutic Activity: 23-37 mins        4:18 PM, 03/24/18 Lonell Grandchild, MPT Physical Therapist with [facility] [phone] office [phone] mobile phone

## 2018-03-24 NOTE — Progress Notes (Signed)
PROGRESS NOTE    Marvin Davis  ZHY:[id number] DOB: 1956-03-17 DOA: 03/23/2018 PCP: Soyla Dryer, PA-C    Brief Narrative:  62 year old male who does not seek regular medical care, with a history of COPD, presents to the hospital with progressive shortness of breath.  He had been using his nebulizers at home for wheezing without significant improvement.  On arrival to the emergency room, he was placed on BiPAP therapy for increased respiratory effort.  Chest x-ray indicated right middle lobe pneumonia.  He was started on intravenous antibiotics.  He is also noted to be pancytopenic and has been receiving PRBC transfusions for significant anemia.  Hematology consulted for pancytopenia.   Assessment & Plan:   Active Problems:   Acute respiratory failure with hypoxia (HCC)   COPD  GOLD III    Hypertension   Anemia   Thrombocytopenia (HCC)   CAP (community acquired pneumonia)   AKI (acute kidney injury) ([place])   1. Acute respiratory failure with hypoxia.  Likely related to COPD exacerbation and pneumonia.  Initially placed on BiPAP therapy.  He was taken off BiPAP this morning.  He says he started to feel little short of breath again, but overall he is feeling better since admission.  We will continue to monitor respiratory status closely, since he may need to go back on BiPAP. 2. Community-acquired pneumonia.  Currently on ceftriaxone and azithromycin.  Urinary antigens have been ordered.  Blood cultures in process. 3. COPD exacerbation.  Continues to have some wheezing.  Will continue on Solu-Medrol.  He is on antibiotics and bronchodilators. 4. Acute kidney injury.  Related to dehydration.  Improved with hydration. 5. Pancytopenia.  Etiology is unclear.  He has had this for several months.  B12 is in the lower range of normal.  Iron and ferritin are elevated. Abdominal ultrasound has been ordered to evaluate liver and spleen.  He does report a history of alcohol use.  He was  transfused 1 unit of PRBC yesterday.  Follow-up hemoglobin after hydration was noted to be 5.9.  His stools are Hemoccult negative.  Another 2 units of PRBCs have been ordered with repeat CBC pending at 3 PM.  Platelets are also in the 40s.  He is leukopenic with an [place] of 100.  Hematology has been consulted.   DVT prophylaxis: SCDs Code Status: Full code Family Communication: No family present Disposition Plan: Discharge home once improved   Consultants:   Hematology  Procedures:     Antimicrobials:   Ceftriaxone 10/28 >  Azithromycin 10/28 >   Subjective: Still short of breath, but feels that breathing is improved today.  Has pleuritic chest pain on the right side of his chest that radiates across his chest  Objective: Vitals:   03/24/18 1129 03/24/18 1200 03/24/18 1300 03/24/18 1400  BP:  114/79 139/88 131/84  Pulse: 81 83 88 90  Resp: (!) 25 (!) 22 (!) 21 (!) 28  Temp: (!) 97.5 F (36.4 C)     TempSrc: Oral     SpO2: 96% 96% 92% 93%  Weight:      Height:        Intake/Output Summary (Last 24 hours) at 03/24/2018 1439 Last data filed at 03/24/2018 1400 Gross per 24 hour  Intake 1994.65 ml  Output 1300 ml  Net 694.65 ml   Filed Weights   03/23/18 0914 03/24/18 0500  Weight: 82.6 kg 80.8 kg    Examination:  General exam: Appears calm and comfortable  Respiratory system: Diminished  breath sounds with mild wheezes.  Mild increased respiratory effort. Cardiovascular system: S1 & S2 heard, RRR. No JVD, murmurs, rubs, gallops or clicks. No pedal edema. Gastrointestinal system: Abdomen is nondistended, soft and nontender. No organomegaly or masses felt. Normal bowel sounds heard. Central nervous system: Alert and oriented. No focal neurological deficits. Extremities: Symmetric 5 x 5 power. Skin: No rashes, lesions or ulcers Psychiatry: Judgement and insight appear normal. Mood & affect appropriate.     Data Reviewed: I have personally reviewed following  labs and imaging studies  CBC: Recent Labs  Lab 03/23/18 0921 03/23/18 1520 03/24/18 0220  WBC 1.0* 0.4* 0.3*  NEUTROABS 0.3*  --  0.1*  HGB 6.0* 6.5* 5.9*  HCT 18.6* 20.3* 18.6*  MCV 87.3 87.9 88.2  PLT 67* 49* 46*   Basic Metabolic Panel: Recent Labs  Lab 03/23/18 0921 03/23/18 1520 03/24/18 0220  NA 139  --  137  K 4.3  --  5.6*  CL 100  --  103  CO2 30  --  28  GLUCOSE 138*  --  169*  BUN 25*  --  22  CREATININE 1.41* 1.19 0.94  CALCIUM 9.1  --  8.6*   GFR: Estimated Creatinine Clearance: 78.7 mL/min (by C-G formula based on SCr of 0.94 mg/dL). Liver Function Tests: No results for input(s): AST, ALT, ALKPHOS, BILITOT, PROT, ALBUMIN in the last 168 hours. No results for input(s): LIPASE, AMYLASE in the last 168 hours. No results for input(s): AMMONIA in the last 168 hours. Coagulation Profile: No results for input(s): INR, PROTIME in the last 168 hours. Cardiac Enzymes: Recent Labs  Lab 03/23/18 0921 03/23/18 1520 03/23/18 1952 03/24/18 0220  TROPONINI 0.08* <0.03 <0.03 <0.03   BNP (last 3 results) No results for input(s): PROBNP in the last 8760 hours. HbA1C: No results for input(s): HGBA1C in the last 72 hours. CBG: No results for input(s): GLUCAP in the last 168 hours. Lipid Profile: No results for input(s): CHOL, HDL, LDLCALC, TRIG, CHOLHDL, LDLDIRECT in the last 72 hours. Thyroid Function Tests: No results for input(s): TSH, T4TOTAL, FREET4, T3FREE, THYROIDAB in the last 72 hours. Anemia Panel: Recent Labs    03/23/18 1520  VITAMINB12 287  FOLATE 13.8  FERRITIN 2,135*  TIBC 159*  IRON 63  RETICCTPCT 0.7   Sepsis Labs: No results for input(s): PROCALCITON, LATICACIDVEN in the last 168 hours.  Recent Results (from the past 240 hour(s))  Culture, blood (routine x 2) Call MD if unable to obtain prior to antibiotics being given     Status: None (Preliminary result)   Collection Time: 03/23/18 10:56 AM  Result Value Ref Range Status    Specimen Description BLOOD RIGHT ARM  Final   Special Requests   Final    BOTTLES DRAWN AEROBIC ONLY Blood Culture adequate volume   Culture   Final    NO GROWTH < 24 HOURS Performed at [facility], [address], [place], [place] [address]    Report Status PENDING  Incomplete  Culture, blood (routine x 2) Call MD if unable to obtain prior to antibiotics being given     Status: None (Preliminary result)   Collection Time: 03/23/18 11:10 AM  Result Value Ref Range Status   Specimen Description BLOOD RIGHT FOREARM  Final   Special Requests   Final    BOTTLES DRAWN AEROBIC AND ANAEROBIC Blood Culture adequate volume   Culture   Final    NO GROWTH < 24 HOURS Performed at [facility],  [address], [place], [place] [address]    Report Status PENDING  Incomplete  MRSA PCR Screening     Status: None   Collection Time: 03/23/18  2:25 PM  Result Value Ref Range Status   MRSA by PCR NEGATIVE NEGATIVE Final    Comment:        The GeneXpert MRSA Assay (FDA approved for NASAL specimens only), is one component of a comprehensive MRSA colonization surveillance program. It is not intended to diagnose MRSA infection nor to guide or monitor treatment for MRSA infections. Performed at [facility], [address], [place], [place] [address]          Radiology Studies: US Abdomen Complete  Result Date: 03/24/2018 CLINICAL DATA:  Thrombocytopenia, anemia, acute kidney injury. EXAM: ABDOMEN ULTRASOUND COMPLETE COMPARISON:  None. FINDINGS: Gallbladder: No gallstones or wall thickening visualized. No sonographic Murphy sign noted by sonographer. Common bile duct: Diameter: 3 mm Liver: No focal lesion identified. Within normal limits in parenchymal echogenicity. Portal vein is patent on color Doppler imaging with normal direction of blood flow towards the liver. IVC: No abnormality visualized. Pancreas: Bowel gas limits evaluation of the pancreatic head and tail. The pancreatic body is grossly  normal. Spleen: Size and appearance within normal limits. Right Kidney: Length: 10.1 cm. The renal cortical echotexture remains lower than that of the liver. There is no hydronephrosis. An echogenic focus in the upper pole of the right kidney measures 9 mm but exhibits no distal shadowing. This may reflect an angiomyolipoma. Left Kidney: Length: 11.1 cm. The renal cortical echotexture is similar to that on the right. Exophytic from the midpole there is an abnormal complex appearing mass measuring 10.5 x 7 x 6.6 cm. There is no hydronephrosis. Abdominal aorta: No aneurysm visualized. Other findings: There is a right pleural effusion. IMPRESSION: Abnormal appearance of the left kidney with a complex mass arising from the midpole suspicious for malignancy. Renal protocol MRI or triphasic renal CT scanning is recommended. 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 Normal appearance of the hepatobiliary tree with the exception of limited visualization of the pancreas. Electronically Signed   By: David  Martinique M.D.   On: 03/24/2018 13:26   Dg Chest Port 1 View  Result Date: 03/23/2018 CLINICAL DATA:  Increased shortness of breath since last night. EXAM: PORTABLE CHEST 1 VIEW COMPARISON:  One-view chest x-ray 06/21/2016 FINDINGS: The heart size is normal. The aortic arch is within normal limits. New right middle lobe airspace disease is consistent with pneumonia. Minimal left basilar atelectasis is present. IMPRESSION: 1. Right middle lobe pneumonia. 2. Minimal left basilar atelectasis. Electronically Signed   By: San Morelle M.D.   On: 03/23/2018 10:08        Scheduled Meds: . amLODipine  10 mg Oral Daily  . chlorhexidine  15 mL Mouth Rinse BID  . guaiFENesin  1,200 mg Oral BID  . ipratropium-albuterol  3 mL Nebulization Q6H  . mouth rinse  15 mL Mouth Rinse q12n4p  . methylPREDNISolone (SOLU-MEDROL) injection  60 mg Intravenous Q6H  . mometasone-formoterol  2  puff Inhalation BID   Continuous Infusions: . sodium chloride 100 mL/hr at 03/24/18 0400  . azithromycin 500 mg (03/24/18 1123)  . cefTRIAXone (ROCEPHIN)  IV 1 g (03/24/18 1031)     LOS: 1 day    Time spent: 36mins    Kathie Dike, MD [facility] Pager [phone]  If 7PM-7AM, please contact night-coverage [email or web address] Password [facility] 03/24/2018, 2:39 PM

## 2018-03-24 NOTE — Plan of Care (Signed)
  Problem: Acute Rehab PT Goals(only PT should resolve) Goal: Pt Will Go Supine/Side To Sit Outcome: Progressing Flowsheets (Taken 03/24/2018 1619) Pt will go Supine/Side to Sit: Independently Goal: Patient Will Transfer Sit To/From Stand Outcome: Progressing Flowsheets (Taken 03/24/2018 1619) Patient will transfer sit to/from stand: with modified independence Goal: Pt Will Transfer Bed To Chair/Chair To Bed Outcome: Progressing Flowsheets (Taken 03/24/2018 1619) Pt will Transfer Bed to Chair/Chair to Bed: with modified independence Goal: Pt Will Ambulate Outcome: Progressing Flowsheets (Taken 03/24/2018 1619) Pt will Ambulate: 50 feet; with modified independence; with rolling walker   4:20 PM, 03/24/18 Lonell Grandchild, MPT Physical Therapist with [facility] [phone] office [phone] mobile phone

## 2018-03-25 LAB — COMPREHENSIVE METABOLIC PANEL
ALBUMIN: 3 g/dL — AB (ref 3.5–5.0)
ALT: 30 U/L (ref 0–44)
AST: 23 U/L (ref 15–41)
Alkaline Phosphatase: 53 U/L (ref 38–126)
Anion gap: 9 (ref 5–15)
BUN: 23 mg/dL (ref 8–23)
CHLORIDE: 105 mmol/L (ref 98–111)
CO2: 26 mmol/L (ref 22–32)
CREATININE: 0.81 mg/dL (ref 0.61–1.24)
Calcium: 8.7 mg/dL — ABNORMAL LOW (ref 8.9–10.3)
GFR calc Af Amer: >60 mL/min (ref >60)
GFR calc non Af Amer: >60 mL/min (ref >60)
GLUCOSE: 200 mg/dL — AB (ref 70–99)
Potassium: 4.8 mmol/L (ref 3.5–5.1)
Sodium: 140 mmol/L (ref 135–145)
Total Bilirubin: 0.7 mg/dL (ref 0.3–1.2)
Total Protein: 7.2 g/dL (ref 6.5–8.1)

## 2018-03-25 LAB — CBC
HEMATOCRIT: 25.7 % — AB (ref 39.0–52.0)
Hemoglobin: 8.3 g/dL — ABNORMAL LOW (ref 13.0–17.0)
MCH: 28.3 pg (ref 26.0–34.0)
MCHC: 32.3 g/dL (ref 30.0–36.0)
MCV: 87.7 fL (ref 80.0–100.0)
NRBC: 8.1 % — AB (ref 0.0–0.2)
PLATELETS: 59 10*3/uL — AB (ref 150–400)
RBC: 2.93 MIL/uL — AB (ref 4.22–5.81)
RDW: 18.1 % — ABNORMAL HIGH (ref 11.5–15.5)
WBC: 0.4 10*3/uL — AB (ref 4.0–10.5)

## 2018-03-25 LAB — LACTATE DEHYDROGENASE: LDH: 380 U/L — ABNORMAL HIGH (ref 98–192)

## 2018-03-25 LAB — PATHOLOGIST SMEAR REVIEW

## 2018-03-25 MED ORDER — ACETAMINOPHEN 325 MG PO TABS
650.0000 mg | ORAL_TABLET | Freq: Four times a day (QID) | ORAL | Status: DC | PRN
  Administered 2018-03-25 – 2018-04-01 (×3): 650 mg via ORAL
  Filled 2018-03-25 (×3): qty 2

## 2018-03-25 MED ORDER — ZOLPIDEM TARTRATE 5 MG PO TABS
5.0000 mg | ORAL_TABLET | Freq: Once | ORAL | Status: AC
  Administered 2018-03-25: 5 mg via ORAL
  Filled 2018-03-25: qty 1

## 2018-03-25 NOTE — Care Management Note (Signed)
Case Management Note  Patient Details  Name: Marvin Davis MRN: [id number] Date of Birth: 16-Sep-1955  Subjective/Objective:  Acute respiratory failure. From home. Independent. Walks with cane. Has continuous oxygen at 2 liters provided by [place], (pays out of pocket).  He reports his concentrator is making a noise, CM will ask Vaughan Basta of [facility] to investigate. Patient recommended for home health PT, he does not want this as he does not have insurance. Will inquire about cost of charity for patient.                    Action/Plan: DC home, potentially with HH PT if there is a cost effective option for patient.   Expected Discharge Date:    unk              Expected Discharge Plan:  [place]  In-House Referral:  Financial Counselor  Discharge planning Services  CM Consult  Post Acute Care Choice:  Home Health Choice offered to:  Patient  DME Arranged:    DME Agency:     HH Arranged:  PT [place]:  [place]  Status of Service:  Completed, signed off  If discussed at [place] of Stay Meetings, dates discussed:    Additional Comments:  Abrahan Fulmore, Chauncey Reading, RN 03/25/2018, 11:21 AM

## 2018-03-25 NOTE — Discharge Summary (Signed)
PROGRESS NOTE    Marvin Davis  AJO:[id number] DOB: [birth year] DOA: 03/23/2018 PCP: Soyla Dryer, PA-C    Brief Narrative:  62 year old male who does not seek regular medical care, with a history of COPD, presents to the hospital with progressive shortness of breath.  He had been using his nebulizers at home for wheezing without significant improvement.  On arrival to the emergency room, he was placed on BiPAP therapy for increased respiratory effort.  Chest x-ray indicated right middle lobe pneumonia.  He was started on intravenous antibiotics.  He is also noted to be pancytopenic and has been receiving PRBC transfusions for significant anemia.  Hematology consulted for pancytopenia.   Assessment & Plan:   Active Problems:   Acute respiratory failure with hypoxia (HCC)   COPD  GOLD III    Hypertension   Anemia   Thrombocytopenia (HCC)   CAP (community acquired pneumonia)   AKI (acute kidney injury) ([place])   1. Acute respiratory failure with hypoxia.  Likely related to COPD exacerbation and pneumonia.  Initially placed on BiPAP therapy but has since been weaned down to nasal cannula.  Still somewhat short of breath.  Continue current treatments 2. Community-acquired pneumonia.  Currently on ceftriaxone and azithromycin.  Urinary antigens have been ordered.  Blood cultures have shown no growth. 3. COPD exacerbation.  Remains short of breath with persistent wheezing.  Continue on bronchodilators, IV steroids, inhaled steroids, antibiotics 4. Acute kidney injury.  Related to dehydration.  Improved with hydration. 5. Pancytopenia.  Etiology is unclear. B12 is in the lower range of normal.  Methylmalonic acid has been ordered.  Iron and ferritin are elevated. Abdominal ultrasound does not show any cirrhosis or splenomegaly.  He does report a history of alcohol use.  He was transfused a total of 3 units of PRBC with improvement in hemoglobin.  His stools are Hemoccult negative.   Platelets have been slowly trending up since admission.  He remains leukopenic.  Hematology consulted, case discussed with Dr. Delton Coombes.  He has recommended bone marrow biopsy.  Request for interventional radiology has been ordered to evaluate 6. Left kidney mass.  Noted on abdominal ultrasound.  Will need further imaging with MRI.  Will await till he is stable from respiratory standpoint and able to lay down flat for procedure.   DVT prophylaxis: SCDs Code Status: Full code Family Communication: No family present Disposition Plan: Discharge home once improved   Consultants:   Hematology  Procedures:     Antimicrobials:   Ceftriaxone 10/28 >  Azithromycin 10/28 >   Subjective: Patient did not require BiPAP last night.  Currently on 4 L of oxygen.  Says he still feels short of breath.  He does have a cough which is largely nonproductive at this time.  Objective: Vitals:   03/25/18 0900 03/25/18 0903 03/25/18 1000 03/25/18 1219  BP: 117/79  (!) 145/75   Pulse: 90  97   Resp: (!) 26  (!) 29   Temp:    98.5 F (36.9 C)  TempSrc:    Oral  SpO2: 95% 95% 94%   Weight:      Height:        Intake/Output Summary (Last 24 hours) at 03/25/2018 1220 Last data filed at 03/25/2018 0900 Gross per 24 hour  Intake 2839.61 ml  Output 1875 ml  Net 964.61 ml   Filed Weights   03/23/18 0914 03/24/18 0500 03/25/18 0500  Weight: 82.6 kg 80.8 kg 84.2 kg    Examination:  General exam: Alert, awake, oriented x 3 Respiratory system: diminished breath sounds bilaterally with bilateral wheezing. Mildly increased respiratory effort Cardiovascular system:RRR. No murmurs, rubs, gallops. Gastrointestinal system: Abdomen is nondistended, soft and nontender. No organomegaly or masses felt. Normal bowel sounds heard. Central nervous system: Alert and oriented. No focal neurological deficits. Extremities: No C/C/E, +pedal pulses Skin: No rashes, lesions or ulcers Psychiatry: Judgement and  insight appear normal. Mood & affect appropriate.      Data Reviewed: I have personally reviewed following labs and imaging studies  CBC: Recent Labs  Lab 03/23/18 0921 03/23/18 1520 03/24/18 0220 03/24/18 1434 03/25/18 0933  WBC 1.0* 0.4* 0.3* 0.4* 0.4*  NEUTROABS 0.3*  --  0.1*  --   --   HGB 6.0* 6.5* 5.9* 8.2* 8.3*  HCT 18.6* 20.3* 18.6* 25.3* 25.7*  MCV 87.3 87.9 88.2 86.9 87.7  PLT 67* 49* 46* 53* 59*   Basic Metabolic Panel: Recent Labs  Lab 03/23/18 0921 03/23/18 1520 03/24/18 0220 03/25/18 0933  NA 139  --  137 140  K 4.3  --  5.6* 4.8  CL 100  --  103 105  CO2 30  --  28 26  GLUCOSE 138*  --  169* 200*  BUN 25*  --  22 23  CREATININE 1.41* 1.19 0.94 0.81  CALCIUM 9.1  --  8.6* 8.7*   GFR: Estimated Creatinine Clearance: 93.2 mL/min (by C-G formula based on SCr of 0.81 mg/dL). Liver Function Tests: Recent Labs  Lab 03/25/18 0933  AST 23  ALT 30  ALKPHOS 53  BILITOT 0.7  PROT 7.2  ALBUMIN 3.0*   No results for input(s): LIPASE, AMYLASE in the last 168 hours. No results for input(s): AMMONIA in the last 168 hours. Coagulation Profile: No results for input(s): INR, PROTIME in the last 168 hours. Cardiac Enzymes: Recent Labs  Lab 03/23/18 0921 03/23/18 1520 03/23/18 1952 03/24/18 0220  TROPONINI 0.08* <0.03 <0.03 <0.03   BNP (last 3 results) No results for input(s): PROBNP in the last 8760 hours. HbA1C: No results for input(s): HGBA1C in the last 72 hours. CBG: No results for input(s): GLUCAP in the last 168 hours. Lipid Profile: No results for input(s): CHOL, HDL, LDLCALC, TRIG, CHOLHDL, LDLDIRECT in the last 72 hours. Thyroid Function Tests: No results for input(s): TSH, T4TOTAL, FREET4, T3FREE, THYROIDAB in the last 72 hours. Anemia Panel: Recent Labs    03/23/18 1520  VITAMINB12 287  FOLATE 13.8  FERRITIN 2,135*  TIBC 159*  IRON 63  RETICCTPCT 0.7   Sepsis Labs: No results for input(s): PROCALCITON, LATICACIDVEN in the  last 168 hours.  Recent Results (from the past 240 hour(s))  Culture, blood (routine x 2) Call MD if unable to obtain prior to antibiotics being given     Status: None (Preliminary result)   Collection Time: 03/23/18 10:56 AM  Result Value Ref Range Status   Specimen Description BLOOD RIGHT ARM  Final   Special Requests   Final    BOTTLES DRAWN AEROBIC ONLY Blood Culture adequate volume   Culture   Final    NO GROWTH 2 DAYS Performed at [facility], [address], [place], [place] [address]    Report Status PENDING  Incomplete  Culture, blood (routine x 2) Call MD if unable to obtain prior to antibiotics being given     Status: None (Preliminary result)   Collection Time: 03/23/18 11:10 AM  Result Value Ref Range Status   Specimen Description BLOOD RIGHT FOREARM  Final   Special Requests   Final    BOTTLES DRAWN AEROBIC AND ANAEROBIC Blood Culture adequate volume   Culture   Final    NO GROWTH 2 DAYS Performed at [facility], [address] [place], [place] [address]    Report Status PENDING  Incomplete  MRSA PCR Screening     Status: None   Collection Time: 03/23/18  2:25 PM  Result Value Ref Range Status   MRSA by PCR NEGATIVE NEGATIVE Final    Comment:        The GeneXpert MRSA Assay (FDA approved for NASAL specimens only), is one component of a comprehensive MRSA colonization surveillance program. It is not intended to diagnose MRSA infection nor to guide or monitor treatment for MRSA infections. Performed at [facility], [address], [place],  [address]          Radiology Studies: US Abdomen Complete  Result Date: 03/24/2018 CLINICAL DATA:  Thrombocytopenia, anemia, acute kidney injury. EXAM: ABDOMEN ULTRASOUND COMPLETE COMPARISON:  None. FINDINGS: Gallbladder: No gallstones or wall thickening visualized. No sonographic Murphy sign noted by sonographer. Common bile duct: Diameter: 3 mm Liver: No focal lesion identified. Within normal limits in  parenchymal echogenicity. Portal vein is patent on color Doppler imaging with normal direction of blood flow towards the liver. IVC: No abnormality visualized. Pancreas: Bowel gas limits evaluation of the pancreatic head and tail. The pancreatic body is grossly normal. Spleen: Size and appearance within normal limits. Right Kidney: Length: 10.1 cm. The renal cortical echotexture remains lower than that of the liver. There is no hydronephrosis. An echogenic focus in the upper pole of the right kidney measures 9 mm but exhibits no distal shadowing. This may reflect an angiomyolipoma. Left Kidney: Length: 11.1 cm. The renal cortical echotexture is similar to that on the right. Exophytic from the midpole there is an abnormal complex appearing mass measuring 10.5 x 7 x 6.6 cm. There is no hydronephrosis. Abdominal aorta: No aneurysm visualized. Other findings: There is a right pleural effusion. IMPRESSION: Abnormal appearance of the left kidney with a complex mass arising from the midpole suspicious for malignancy. Renal protocol MRI or triphasic renal CT scanning is recommended. 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 Normal appearance of the hepatobiliary tree with the exception of limited visualization of the pancreas. Electronically Signed   By: David  Martinique M.D.   On: 03/24/2018 13:26        Scheduled Meds: . amLODipine  10 mg Oral Daily  . chlorhexidine  15 mL Mouth Rinse BID  . feeding supplement (ENSURE ENLIVE)  237 mL Oral BID BM  . guaiFENesin  1,200 mg Oral BID  . ipratropium-albuterol  3 mL Nebulization Q6H  . mouth rinse  15 mL Mouth Rinse q12n4p  . methylPREDNISolone (SOLU-MEDROL) injection  60 mg Intravenous Q6H  . mometasone-formoterol  2 puff Inhalation BID   Continuous Infusions: . sodium chloride 100 mL/hr at 03/25/18 0800  . azithromycin 500 mg (03/25/18 1149)  . cefTRIAXone (ROCEPHIN)  IV 1 g (03/25/18 1057)     LOS: 2 days    Time  spent: 84mns    JKathie Dike MD [facility] Pager [phone] If 7PM-7AM, please contact night-coverage [email or web address] Password [email or web address] 03/25/2018, 12:20 PM

## 2018-03-25 NOTE — Progress Notes (Signed)
CRITICAL VALUE ALERT  Critical Value:  WBC 0.4  Date & Time Notied: 03/25/18 @ 4076  Provider Notified: Dr. Roderic Palau  Orders Received/Actions taken: No new orders currently

## 2018-03-25 NOTE — Progress Notes (Signed)
OT Cancellation Note  Patient Details Name: Marvin Davis MRN: [id number] DOB: 04-Oct-1955   Cancelled Treatment:    Reason Eval/Treat Not Completed: OT screened, no needs identified, will sign off. Pt appears to be at baseline level of functioning at this time with ADLs. Educated pt on energy conservation and PLB. No further acute care OT needs at this time. Thank you for this recommendation.     Guadelupe Sabin, OTR/L  [phone] 03/25/2018, 8:38 AM

## 2018-03-25 NOTE — Progress Notes (Signed)
Patient ID: Marvin Davis, male   DOB: 05/11/1956, 62 y.o.   MRN: [id number]  Pt is scheduled for IR procedure at [facility] 11/1  See orders  Dr Roderic Palau and RN aware

## 2018-03-26 ENCOUNTER — Other Ambulatory Visit: Payer: Self-pay | Admitting: Radiology

## 2018-03-26 LAB — BASIC METABOLIC PANEL
ANION GAP: 8 (ref 5–15)
BUN: 22 mg/dL (ref 8–23)
CALCIUM: 8.7 mg/dL — AB (ref 8.9–10.3)
CHLORIDE: 108 mmol/L (ref 98–111)
CO2: 27 mmol/L (ref 22–32)
CREATININE: 0.75 mg/dL (ref 0.61–1.24)
GFR calc Af Amer: >60 mL/min (ref >60)
GFR calc non Af Amer: >60 mL/min (ref >60)
Glucose, Bld: 160 mg/dL — ABNORMAL HIGH (ref 70–99)
Potassium: 4.4 mmol/L (ref 3.5–5.1)
Sodium: 143 mmol/L (ref 135–145)

## 2018-03-26 LAB — MULTIPLE MYELOMA PANEL, SERUM
ALBUMIN/GLOB SERPL: 0.8 (ref 0.7–1.7)
ALPHA 1: 0.6 g/dL — AB (ref 0.0–0.4)
ALPHA2 GLOB SERPL ELPH-MCNC: 1.2 g/dL — AB (ref 0.4–1.0)
Albumin SerPl Elph-Mcnc: 3.1 g/dL (ref 2.9–4.4)
B-Globulin SerPl Elph-Mcnc: 0.9 g/dL (ref 0.7–1.3)
Gamma Glob SerPl Elph-Mcnc: 1.3 g/dL (ref 0.4–1.8)
Globulin, Total: 4 g/dL — ABNORMAL HIGH (ref 2.2–3.9)
IGG (IMMUNOGLOBIN G), SERUM: 1203 mg/dL (ref 700–1600)
IGM (IMMUNOGLOBULIN M), SRM: 179 mg/dL — AB (ref 20–172)
IgA: 245 mg/dL (ref 61–437)
TOTAL PROTEIN ELP: 7.1 g/dL (ref 6.0–8.5)

## 2018-03-26 LAB — CBC WITH DIFFERENTIAL/PLATELET
ABS IMMATURE GRANULOCYTES: 0.03 10*3/uL (ref 0.00–0.07)
BASOS ABS: 0 10*3/uL (ref 0.0–0.1)
Basophils Relative: 0 %
EOS ABS: 0 10*3/uL (ref 0.0–0.5)
Eosinophils Relative: 0 %
HCT: 26.7 % — ABNORMAL LOW (ref 39.0–52.0)
HEMOGLOBIN: 8.4 g/dL — AB (ref 13.0–17.0)
IMMATURE GRANULOCYTES: 8 %
LYMPHS ABS: 0.1 10*3/uL — AB (ref 0.7–4.0)
LYMPHS PCT: 19 %
MCH: 27.9 pg (ref 26.0–34.0)
MCHC: 31.5 g/dL (ref 30.0–36.0)
MCV: 88.7 fL (ref 80.0–100.0)
MONOS PCT: 3 %
Monocytes Absolute: 0 10*3/uL — ABNORMAL LOW (ref 0.1–1.0)
NRBC: 5.6 % — AB (ref 0.0–0.2)
Neutro Abs: 0.3 10*3/uL — ABNORMAL LOW (ref 1.7–7.7)
Neutrophils Relative %: 70 %
Platelets: 60 10*3/uL — ABNORMAL LOW (ref 150–400)
RBC: 3.01 MIL/uL — ABNORMAL LOW (ref 4.22–5.81)
RDW: 18.6 % — AB (ref 11.5–15.5)
WBC: 0.4 10*3/uL — CL (ref 4.0–10.5)

## 2018-03-26 LAB — METHYLMALONIC ACID, SERUM: Methylmalonic Acid, Quantitative: 219 nmol/L (ref 0–378)

## 2018-03-26 LAB — PROTIME-INR
INR: 1.36
PROTHROMBIN TIME: 16.6 s — AB (ref 11.4–15.2)

## 2018-03-26 MED ORDER — ZOLPIDEM TARTRATE 5 MG PO TABS
5.0000 mg | ORAL_TABLET | Freq: Every evening | ORAL | Status: DC | PRN
  Administered 2018-03-26: 5 mg via ORAL
  Filled 2018-03-26: qty 1

## 2018-03-26 NOTE — Progress Notes (Signed)
PROGRESS NOTE    Marvin Davis  OVZ:[id number] DOB: 1956/03/06 DOA: 03/23/2018 PCP: Soyla Dryer, PA-C    Brief Narrative:  62 year old male who does not seek regular medical care, with a history of COPD, presents to the hospital with progressive shortness of breath.  He had been using his nebulizers at home for wheezing without significant improvement.  On arrival to the emergency room, he was placed on BiPAP therapy for increased respiratory effort.  Chest x-ray indicated right middle lobe pneumonia.  He was started on intravenous antibiotics.  He is also noted to be pancytopenic and has been receiving PRBC transfusions for significant anemia.  Hematology consulted for pancytopenia.   Assessment & Plan:   Active Problems:   Acute respiratory failure with hypoxia (HCC)   COPD  GOLD III    Hypertension   Anemia   Thrombocytopenia (HCC)   CAP (community acquired pneumonia)   AKI (acute kidney injury) ([place])   1. Acute respiratory failure with hypoxia.  Likely related to COPD exacerbation and pneumonia.  Initially placed on BiPAP therapy but has since been weaned down to nasal cannula.  Still somewhat short of breath.  Continue current treatments 2. Community-acquired pneumonia.  Currently on ceftriaxone and azithromycin.  Urinary antigens have been ordered.  Blood cultures have shown no growth. 3. COPD exacerbation.  Still short of breath.  Wheezing is slowly improving.  Continue on bronchodilators, IV steroids, inhaled steroids, antibiotics 4. Acute kidney injury.  Related to dehydration.  Improved with hydration. 5. Pancytopenia.  Etiology is unclear. B12 is in the lower range of normal.  Methylmalonic acid has been ordered.  Iron and ferritin are elevated. Abdominal ultrasound does not show any cirrhosis or splenomegaly.  He does report a history of alcohol use.  He was transfused a total of 3 units of PRBC with improvement in hemoglobin.  His stools are Hemoccult negative.   Platelets have been slowly trending up since admission.  He remains leukopenic.  Hematology consulted, case discussed with Dr. Delton Coombes.  He has recommended bone marrow biopsy.  Discussed with interventional radiology and plan will be to undergo biopsy on 11/1 6. Left kidney mass.  Noted on abdominal ultrasound.  Will need further imaging with MRI.  Will wait till he is stable from respiratory standpoint and able to lay down flat for scan.   DVT prophylaxis: SCDs Code Status: Full code Family Communication: No family present Disposition Plan: Discharge home once improved   Consultants:   Hematology  Procedures:     Antimicrobials:   Ceftriaxone 10/28 >  Azithromycin 10/28 >   Subjective: Reports a nonproductive cough.  Still short of breath, not back to baseline.  Objective: Vitals:   03/26/18 1454 03/26/18 1500 03/26/18 1600 03/26/18 1700  BP:  122/75 (!) 137/57 (!) 141/83  Pulse:  88 86 88  Resp:  (!) 30 (!) 23 (!) 23  Temp:  (!) 97.5 F (36.4 C)    TempSrc:      SpO2: 97% 98% 98% 96%  Weight:      Height:        Intake/Output Summary (Last 24 hours) at 03/26/2018 1903 Last data filed at 03/26/2018 1836 Gross per 24 hour  Intake 3908.19 ml  Output 2100 ml  Net 1808.19 ml   Filed Weights   03/24/18 0500 03/25/18 0500 03/26/18 0447  Weight: 80.8 kg 84.2 kg 86 kg    Examination:  General exam: Alert, awake, oriented x 3 Respiratory system: Diminished breath sounds but  overall wheezing is improving.  Still becomes mildly short of breath in conversation.. Cardiovascular system:RRR. No murmurs, rubs, gallops. Gastrointestinal system: Abdomen is nondistended, soft and nontender. No organomegaly or masses felt. Normal bowel sounds heard. Central nervous system: Alert and oriented. No focal neurological deficits. Extremities: No C/C/E, +pedal pulses Skin: No rashes, lesions or ulcers Psychiatry: Judgement and insight appear normal. Mood & affect appropriate.      Data Reviewed: I have personally reviewed following labs and imaging studies  CBC: Recent Labs  Lab 03/23/18 0921 03/23/18 1520 03/24/18 0220 03/24/18 1434 03/25/18 0933 03/26/18 1351  WBC 1.0* 0.4* 0.3* 0.4* 0.4* 0.4*  NEUTROABS 0.3*  --  0.1*  --   --  0.3*  HGB 6.0* 6.5* 5.9* 8.2* 8.3* 8.4*  HCT 18.6* 20.3* 18.6* 25.3* 25.7* 26.7*  MCV 87.3 87.9 88.2 86.9 87.7 88.7  PLT 67* 49* 46* 53* 59* 60*   Basic Metabolic Panel: Recent Labs  Lab 03/23/18 0921 03/23/18 1520 03/24/18 0220 03/25/18 0933 03/26/18 1351  NA 139  --  137 140 143  K 4.3  --  5.6* 4.8 4.4  CL 100  --  103 105 108  CO2 30  --  28 26 27   GLUCOSE 138*  --  169* 200* 160*  BUN 25*  --  22 23 22   CREATININE 1.41* 1.19 0.94 0.81 0.75  CALCIUM 9.1  --  8.6* 8.7* 8.7*   GFR: Estimated Creatinine Clearance: 95.3 mL/min (by C-G formula based on SCr of 0.75 mg/dL). Liver Function Tests: Recent Labs  Lab 03/25/18 0933  AST 23  ALT 30  ALKPHOS 53  BILITOT 0.7  PROT 7.2  ALBUMIN 3.0*   No results for input(s): LIPASE, AMYLASE in the last 168 hours. No results for input(s): AMMONIA in the last 168 hours. Coagulation Profile: Recent Labs  Lab 03/26/18 1351  INR 1.36   Cardiac Enzymes: Recent Labs  Lab 03/23/18 0921 03/23/18 1520 03/23/18 1952 03/24/18 0220  TROPONINI 0.08* <0.03 <0.03 <0.03   BNP (last 3 results) No results for input(s): PROBNP in the last 8760 hours. HbA1C: No results for input(s): HGBA1C in the last 72 hours. CBG: No results for input(s): GLUCAP in the last 168 hours. Lipid Profile: No results for input(s): CHOL, HDL, LDLCALC, TRIG, CHOLHDL, LDLDIRECT in the last 72 hours. Thyroid Function Tests: No results for input(s): TSH, T4TOTAL, FREET4, T3FREE, THYROIDAB in the last 72 hours. Anemia Panel: No results for input(s): VITAMINB12, FOLATE, FERRITIN, TIBC, IRON, RETICCTPCT in the last 72 hours. Sepsis Labs: No results for input(s): PROCALCITON, LATICACIDVEN in the  last 168 hours.  Recent Results (from the past 240 hour(s))  Culture, blood (routine x 2) Call MD if unable to obtain prior to antibiotics being given     Status: None (Preliminary result)   Collection Time: 03/23/18 10:56 AM  Result Value Ref Range Status   Specimen Description BLOOD RIGHT ARM  Final   Special Requests   Final    BOTTLES DRAWN AEROBIC ONLY Blood Culture adequate volume   Culture   Final    NO GROWTH 3 DAYS Performed at [facility], [address] [place], [place] [address]    Report Status PENDING  Incomplete  Culture, blood (routine x 2) Call MD if unable to obtain prior to antibiotics being given     Status: None (Preliminary result)   Collection Time: 03/23/18 11:10 AM  Result Value Ref Range Status   Specimen Description BLOOD RIGHT FOREARM  Final  Special Requests   Final    BOTTLES DRAWN AEROBIC AND ANAEROBIC Blood Culture adequate volume   Culture   Final    NO GROWTH 3 DAYS Performed at [facility], [address] [place], [place] [address]    Report Status PENDING  Incomplete  MRSA PCR Screening     Status: None   Collection Time: 03/23/18  2:25 PM  Result Value Ref Range Status   MRSA by PCR NEGATIVE NEGATIVE Final    Comment:        The GeneXpert MRSA Assay (FDA approved for NASAL specimens only), is one component of a comprehensive MRSA colonization surveillance program. It is not intended to diagnose MRSA infection nor to guide or monitor treatment for MRSA infections. Performed at [facility], [address] [place],  [address]          Radiology Studies: No results found.      Scheduled Meds: . amLODipine  10 mg Oral Daily  . chlorhexidine  15 mL Mouth Rinse BID  . feeding supplement (ENSURE ENLIVE)  237 mL Oral BID BM  . guaiFENesin  1,200 mg Oral BID  . ipratropium-albuterol  3 mL Nebulization Q6H  . mouth rinse  15 mL Mouth Rinse q12n4p  . methylPREDNISolone (SOLU-MEDROL) injection  60 mg Intravenous Q6H  .  mometasone-formoterol  2 puff Inhalation BID   Continuous Infusions: . sodium chloride 100 mL/hr at 03/26/18 1731  . azithromycin Stopped (03/26/18 1223)  . cefTRIAXone (ROCEPHIN)  IV Stopped (03/26/18 1044)     LOS: 3 days    Time spent: 51mns    JKathie Dike MD [facility] Pager [phone] If 7PM-7AM, please contact night-coverage [email or web address] Password [email or web address] 03/26/2018, 7:03 PM

## 2018-03-26 NOTE — Progress Notes (Signed)
Physical Therapy Treatment Patient Details Name: KO BARDON MRN: [id number] DOB: 1956-02-24 Today's Date: 03/26/2018    History of Present Illness Marvin Davis is a 62 y.o. male with medical history significant of COPD.  History is somewhat limited since he is currently on BiPAP and no family is present to offer further history.  Per medical record, patient has been short of breath for the past month.  He had increased wheezing that has not improved with his nebulizer treatments.  Over the past few days he had a worsening cough.  He describes a tightness across his chest.  No reported fever, nausea, vomiting or diarrhea    PT Comments    Patient demonstrates increased endurance/distance for taking steps in room and into hallway while on 4 LPM O2 with O2 saturation remaining between 93-95%, no loss of balance and limited mostly due to SOB/fatigue.  Patient demonstrated good return for completing BLE exercises while seated at bedside and when sitting in chair, when supplemental O2 decreased to 3 LPM, patient had difficulty keeping O2 saturations above 87% and put back on 4 LPM for rest of exercises and gait training.  Patient tolerated sitting up in chair after therapy - RN aware.  Patient will benefit from continued physical therapy in hospital and recommended venue below to increase strength, balance, endurance for safe ADLs and gait.    Follow Up Recommendations  Home health PT;Supervision - Intermittent     Equipment Recommendations  None recommended by PT    Recommendations for Other Services       Precautions / Restrictions Precautions Precautions: None Precaution Comments: monitor O2 saturations, Home O2 dependent Restrictions Weight Bearing Restrictions: No    Mobility  Bed Mobility Overal bed mobility: Modified Independent                Transfers Overall transfer level: Modified independent   Transfers: Sit to/from Stand;Stand Pivot  Transfers Sit to Stand: Modified independent (Device/Increase time) Stand pivot transfers: Modified independent (Device/Increase time)       General transfer comment: increased time  Ambulation/Gait Ambulation/Gait assistance: Supervision Gait Distance (Feet): 35 Feet Assistive device: None Gait Pattern/deviations: Decreased step length - right;Decreased step length - left;Decreased stride length Gait velocity: decreased   General Gait Details: increased endurance/distance for ambulation without loss of balance while on 4 LPM O2 and O2 saturation at 93-95%   Stairs             Wheelchair Mobility    Modified Rankin (Stroke Patients Only)       Balance Overall balance assessment: No apparent balance deficits (not formally assessed)                                          Cognition Arousal/Alertness: Awake/alert Behavior During Therapy: WFL for tasks assessed/performed Overall Cognitive Status: Within Functional Limits for tasks assessed                                        Exercises General Exercises - Lower Extremity Long Arc Quad: Seated;Strengthening;AROM;Both;10 reps Hip Flexion/Marching: Seated;AROM;Strengthening;Both;10 reps Toe Raises: Seated;Strengthening;AROM;Both;10 reps Heel Raises: Seated;AROM;Both;10 reps;Strengthening    General Comments        Pertinent Vitals/Pain Pain Assessment: 0-10 Pain Score: 2  Pain Location: mild chest discomfort right side  when coughing Pain Descriptors / Indicators: Discomfort Pain Intervention(s): Limited activity within patient's tolerance;Monitored during session    Home Living                      Prior Function            PT Goals (current goals can now be found in the care plan section) Acute Rehab PT Goals Patient Stated Goal: return home with family to assist PT Goal Formulation: With patient Time For Goal Achievement: 03/31/18 Potential to Achieve  Goals: Good Progress towards PT goals: Progressing toward goals    Frequency    Min 3X/week      PT Plan Current plan remains appropriate    Co-evaluation              AM-PAC PT "6 Clicks" Daily Activity  Outcome Measure  Difficulty turning over in bed (including adjusting bedclothes, sheets and blankets)?: None Difficulty moving from lying on back to sitting on the side of the bed? : None Difficulty sitting down on and standing up from a chair with arms (e.g., wheelchair, bedside commode, etc,.)?: A Little Help needed moving to and from a bed to chair (including a wheelchair)?: A Little Help needed walking in hospital room?: A Little Help needed climbing 3-5 steps with a railing? : A Little 6 Click Score: 20    End of Session Equipment Utilized During Treatment: Oxygen Activity Tolerance: Patient tolerated treatment well;Patient limited by fatigue Patient left: in chair;with call bell/phone within reach Nurse Communication: Mobility status PT Visit Diagnosis: Unsteadiness on feet (R26.81);Other abnormalities of gait and mobility (R26.89);Muscle weakness (generalized) (M62.81)     Time: 4665-9935 PT Time Calculation (min) (ACUTE ONLY): 42 min  Charges:  $Therapeutic Exercise: 8-22 mins $Therapeutic Activity: 8-22 mins                     2:02 PM, 03/26/18 Lonell Grandchild, MPT Physical Therapist with [facility] [phone] office [phone] mobile phone

## 2018-03-27 ENCOUNTER — Ambulatory Visit (HOSPITAL_COMMUNITY): Admit: 2018-03-27 | Payer: Self-pay

## 2018-03-27 LAB — BASIC METABOLIC PANEL
Anion gap: 4 — ABNORMAL LOW (ref 5–15)
BUN: 20 mg/dL (ref 8–23)
CALCIUM: 8.5 mg/dL — AB (ref 8.9–10.3)
CO2: 29 mmol/L (ref 22–32)
CREATININE: 0.65 mg/dL (ref 0.61–1.24)
Chloride: 108 mmol/L (ref 98–111)
GFR calc Af Amer: >60 mL/min (ref >60)
GFR calc non Af Amer: >60 mL/min (ref >60)
GLUCOSE: 168 mg/dL — AB (ref 70–99)
Potassium: 4.8 mmol/L (ref 3.5–5.1)
Sodium: 141 mmol/L (ref 135–145)

## 2018-03-27 LAB — BPAM RBC
BLOOD PRODUCT EXPIRATION DATE: 201911232359
BLOOD PRODUCT EXPIRATION DATE: 201912012359
Blood Product Expiration Date: 201911232359
Blood Product Expiration Date: 201912012359
ISSUE DATE / TIME: 201910281220
ISSUE DATE / TIME: 201910290437
ISSUE DATE / TIME: 201910290903
UNIT TYPE AND RH: 5100
UNIT TYPE AND RH: 5100
Unit Type and Rh: 5100
Unit Type and Rh: 5100

## 2018-03-27 LAB — TYPE AND SCREEN
ABO/RH(D): O POS
ANTIBODY SCREEN: NEGATIVE
Unit division: 0
Unit division: 0
Unit division: 0
Unit division: 0

## 2018-03-27 LAB — CBC WITH DIFFERENTIAL/PLATELET
Abs Immature Granulocytes: 0.03 10*3/uL (ref 0.00–0.07)
Basophils Absolute: 0 10*3/uL (ref 0.0–0.1)
Basophils Relative: 0 %
EOS ABS: 0 10*3/uL (ref 0.0–0.5)
EOS PCT: 0 %
HEMATOCRIT: 23.5 % — AB (ref 39.0–52.0)
HEMOGLOBIN: 7.5 g/dL — AB (ref 13.0–17.0)
Immature Granulocytes: 10 %
LYMPHS ABS: 0.1 10*3/uL — AB (ref 0.7–4.0)
LYMPHS PCT: 23 %
MCH: 28.8 pg (ref 26.0–34.0)
MCHC: 31.9 g/dL (ref 30.0–36.0)
MCV: 90.4 fL (ref 80.0–100.0)
MONOS PCT: 3 %
Monocytes Absolute: 0 10*3/uL — ABNORMAL LOW (ref 0.1–1.0)
Neutro Abs: 0.2 10*3/uL — ABNORMAL LOW (ref 1.7–7.7)
Neutrophils Relative %: 64 %
Platelets: 42 10*3/uL — ABNORMAL LOW (ref 150–400)
RBC: 2.6 MIL/uL — ABNORMAL LOW (ref 4.22–5.81)
RDW: 18.4 % — AB (ref 11.5–15.5)
WBC: 0.3 10*3/uL — CL (ref 4.0–10.5)
nRBC: 6.5 % — ABNORMAL HIGH (ref 0.0–0.2)

## 2018-03-27 MED ORDER — AMOXICILLIN-POT CLAVULANATE 875-125 MG PO TABS
1.0000 | ORAL_TABLET | Freq: Two times a day (BID) | ORAL | Status: DC
  Administered 2018-03-27 – 2018-03-31 (×9): 1 via ORAL
  Filled 2018-03-27 (×9): qty 1

## 2018-03-27 MED ORDER — METHYLPREDNISOLONE SODIUM SUCC 125 MG IJ SOLR
60.0000 mg | Freq: Three times a day (TID) | INTRAMUSCULAR | Status: DC
  Administered 2018-03-27 – 2018-03-30 (×9): 60 mg via INTRAVENOUS
  Filled 2018-03-27 (×9): qty 2

## 2018-03-27 MED ORDER — HYDROCODONE-ACETAMINOPHEN 5-325 MG PO TABS
1.0000 | ORAL_TABLET | Freq: Four times a day (QID) | ORAL | Status: DC | PRN
  Administered 2018-03-27: 2 via ORAL
  Filled 2018-03-27: qty 2

## 2018-03-27 MED ORDER — ARFORMOTEROL TARTRATE 15 MCG/2ML IN NEBU
15.0000 ug | INHALATION_SOLUTION | Freq: Two times a day (BID) | RESPIRATORY_TRACT | Status: DC
  Administered 2018-03-27 – 2018-04-02 (×14): 15 ug via RESPIRATORY_TRACT
  Filled 2018-03-27 (×14): qty 2

## 2018-03-27 MED ORDER — BUDESONIDE 0.5 MG/2ML IN SUSP
0.5000 mg | Freq: Two times a day (BID) | RESPIRATORY_TRACT | Status: DC
  Administered 2018-03-27 – 2018-04-02 (×14): 0.5 mg via RESPIRATORY_TRACT
  Filled 2018-03-27 (×14): qty 2

## 2018-03-27 MED ORDER — SODIUM CHLORIDE 0.9% FLUSH
3.0000 mL | Freq: Two times a day (BID) | INTRAVENOUS | Status: DC
  Administered 2018-03-27 – 2018-04-02 (×12): 3 mL via INTRAVENOUS

## 2018-03-27 NOTE — Progress Notes (Signed)
0720: received report from Nestor Lewandowsky, RN who stated he called [facility] and was told by [facility] that they would add Mr. Kane to their list, but would probably be late picking up pt.  [phone]: received call from [facility] stating they are 25-30 minutes away.  0753 received call from Monia Sabal, [place] stating they would have to cancel pt appointment.

## 2018-03-27 NOTE — Progress Notes (Signed)
PROGRESS NOTE    Marvin Davis  KVQ:[id number] DOB: 1955-08-04 DOA: 03/23/2018 PCP: Soyla Dryer, PA-C    Brief Narrative:  62 year old male who does not seek regular medical care, with a history of COPD, presents to the hospital with progressive shortness of breath.  He had been using his nebulizers at home for wheezing without significant improvement.  On arrival to the emergency room, he was placed on BiPAP therapy for increased respiratory effort.  Chest x-ray indicated right middle lobe pneumonia.  He was started on intravenous antibiotics.  He is also noted to be pancytopenic and has been receiving PRBC transfusions for significant anemia.  Hematology consulted for pancytopenia.   Assessment & Plan:   Active Problems:   Acute respiratory failure with hypoxia (HCC)   COPD  GOLD III    Hypertension   Anemia   Thrombocytopenia (HCC)   CAP (community acquired pneumonia)   AKI (acute kidney injury) ([place])   1. Acute respiratory failure with hypoxia.  Likely related to COPD exacerbation and pneumonia.  Initially placed on BiPAP therapy but has since been weaned down to nasal cannula.  Still somewhat short of breath.  Continue current treatments and weaned O2 supplementation as tolerated. 2. Community-acquired pneumonia.  Currently on ceftriaxone and azithromycin.  Urinary antigens have been ordered.  Blood cultures have shown no growth. 3. COPD exacerbation.  Still short of breath.  slowly improving.  Continue on bronchodilators, IV steroids, start nebulized steroids and switched antibiotics to PO. Started on flutter valve. 4. Acute kidney injury.  Related to dehydration.  Improved with hydration. 5. Pancytopenia.  Etiology is unclear. B12 is in the lower range of normal.  Methylmalonic acid has been ordered.  Iron and ferritin are elevated. Abdominal ultrasound does not show any cirrhosis or splenomegaly.  He does report a history of alcohol use.  He was transfused a total of 3  units of PRBC with improvement in hemoglobin.  His stools are Hemoccult negative.  Platelets have been slowly trending up since admission.  He remains leukopenic.  Hematology consulted, case discussed with Dr. Delton Coombes.  He has recommended bone marrow biopsy.  Discussed with interventional radiology and plan will be to undergo biopsy on 11/4 6. Left kidney mass.  Noted on abdominal ultrasound.  Will need further imaging with MRI.  Will wait till he is stable from respiratory standpoint and able to lay down flat for scan.   DVT prophylaxis: SCDs Code Status: Full code Family Communication: No family present Disposition Plan: Discharge home once improved; still requiring oxygen, blood line continued to be down, bone marrow pending, very easily winded with exertion having difficulty speaking full sentences.   Consultants:   Hematology  Procedures:   Bone marrow biopsy planned for 03/30/18  Antimicrobials:   Ceftriaxone 10/28 >11/1  Azithromycin 10/28 >11/1  augmentin 11/1   Subjective: Patient continued to have nonproductive cough, he is afebrile, denies chest pain, still short of breath with minimal exertion, having difficulty speaking in full sentences and requiring oxygen supplementation.  Objective: Vitals:   03/27/18 1300 03/27/18 1400 03/27/18 1430 03/27/18 1621  BP:      Pulse: 93 89  83  Resp: (!) 26 19  (!) 27  Temp:    98 F (36.7 C)  TempSrc:    Oral  SpO2: 98% 97% 97% 98%  Weight:      Height:        Intake/Output Summary (Last 24 hours) at 03/27/2018 1714 Last data filed at 03/27/2018  1356 Gross per 24 hour  Intake 3045.93 ml  Output 2200 ml  Net 845.93 ml   Filed Weights   03/24/18 0500 03/25/18 0500 03/26/18 0447  Weight: 80.8 kg 84.2 kg 86 kg    Examination: General exam: Alert, awake, oriented x 3; still short of breath with minimal exertion, having difficulty speaking in full sentences.  Patient is afebrile. Respiratory system: Fair air movement  bilaterally, positive expiratory wheezing, no using accessory muscles.  Requiring oxygen supplementation to maintain O2 sats.  Cardiovascular system:RRR. No rubs, no gallops, positive SEM appreciated on exam.  Gastrointestinal system: Abdomen is nondistended, soft and nontender. No organomegaly or masses felt. Normal bowel sounds heard. Central nervous system: Alert and oriented. No focal neurological deficits. Extremities: No C/C/E, +pedal pulses Skin: No rashes, lesions or ulcers Psychiatry: Judgement and insight appear normal. Mood & affect appropriate.   Data Reviewed: I have personally reviewed following labs and imaging studies  CBC: Recent Labs  Lab 03/23/18 0921  03/24/18 0220 03/24/18 1434 03/25/18 0933 03/26/18 1351 03/27/18 0403  WBC 1.0*   < > 0.3* 0.4* 0.4* 0.4* 0.3*  NEUTROABS 0.3*  --  0.1*  --   --  0.3* 0.2*  HGB 6.0*   < > 5.9* 8.2* 8.3* 8.4* 7.5*  HCT 18.6*   < > 18.6* 25.3* 25.7* 26.7* 23.5*  MCV 87.3   < > 88.2 86.9 87.7 88.7 90.4  PLT 67*   < > 46* 53* 59* 60* 42*   < > = values in this interval not displayed.   Basic Metabolic Panel: Recent Labs  Lab 03/23/18 0921 03/23/18 1520 03/24/18 0220 03/25/18 0933 03/26/18 1351 03/27/18 0403  NA 139  --  137 140 143 141  K 4.3  --  5.6* 4.8 4.4 4.8  CL 100  --  103 105 108 108  CO2 30  --  28 26 27 29   GLUCOSE 138*  --  169* 200* 160* 168*  BUN 25*  --  22 23 22 20   CREATININE 1.41* 1.19 0.94 0.81 0.75 0.65  CALCIUM 9.1  --  8.6* 8.7* 8.7* 8.5*   GFR: Estimated Creatinine Clearance: 95.3 mL/min (by C-G formula based on SCr of 0.65 mg/dL).   Liver Function Tests: Recent Labs  Lab 03/25/18 0933  AST 23  ALT 30  ALKPHOS 53  BILITOT 0.7  PROT 7.2  ALBUMIN 3.0*   Coagulation Profile: Recent Labs  Lab 03/26/18 1351  INR 1.36   Cardiac Enzymes: Recent Labs  Lab 03/23/18 0921 03/23/18 1520 03/23/18 1952 03/24/18 0220  TROPONINI 0.08* <0.03 <0.03 <0.03    Recent Results (from the past  240 hour(s))  Culture, blood (routine x 2) Call MD if unable to obtain prior to antibiotics being given     Status: None (Preliminary result)   Collection Time: 03/23/18 10:56 AM  Result Value Ref Range Status   Specimen Description BLOOD RIGHT ARM  Final   Special Requests   Final    BOTTLES DRAWN AEROBIC ONLY Blood Culture adequate volume   Culture   Final    NO GROWTH 4 DAYS Performed at [facility], [address], [place], [place] [address]    Report Status PENDING  Incomplete  Culture, blood (routine x 2) Call MD if unable to obtain prior to antibiotics being given     Status: None (Preliminary result)   Collection Time: 03/23/18 11:10 AM  Result Value Ref Range Status   Specimen Description BLOOD RIGHT FOREARM  Final   Special Requests   Final    BOTTLES DRAWN AEROBIC AND ANAEROBIC Blood Culture adequate volume   Culture   Final    NO GROWTH 4 DAYS Performed at [facility], [address] [place], [place] [address]    Report Status PENDING  Incomplete  MRSA PCR Screening     Status: None   Collection Time: 03/23/18  2:25 PM  Result Value Ref Range Status   MRSA by PCR NEGATIVE NEGATIVE Final    Comment:        The GeneXpert MRSA Assay (FDA approved for NASAL specimens only), is one component of a comprehensive MRSA colonization surveillance program. It is not intended to diagnose MRSA infection nor to guide or monitor treatment for MRSA infections. Performed at [facility], [address] [place], [place] [address]      Radiology Studies: No results found.  Scheduled Meds: . amLODipine  10 mg Oral Daily  . amoxicillin-clavulanate  1 tablet Oral Q12H  . arformoterol  15 mcg Nebulization BID  . budesonide (PULMICORT) nebulizer solution  0.5 mg Nebulization BID  . chlorhexidine  15 mL Mouth Rinse BID  . feeding supplement (ENSURE ENLIVE)  237 mL Oral BID BM  . guaiFENesin  1,200 mg Oral BID  . ipratropium-albuterol  3 mL Nebulization Q6H  . mouth rinse  15  mL Mouth Rinse q12n4p  . methylPREDNISolone (SOLU-MEDROL) injection  60 mg Intravenous Q8H   Continuous Infusions: . sodium chloride 10 mL/hr at 03/27/18 1356     LOS: 4 days    Time spent: 30 minutes   Barton Dubois, MD [facility] Pager [phone]  If 7PM-7AM, please contact night-coverage [email or web address] Password [email or web address] 03/27/2018, 5:14 PM

## 2018-03-27 NOTE — Progress Notes (Signed)
Lab called critical WBC of 0.3. Down from yesterdays value of 0.4. MD notified via text page. Pt is on neutropenic precautions.

## 2018-03-27 NOTE — Progress Notes (Signed)
Tentatively working out time for Monday 11/4 for CT guided bone marrow biopsy to be done at [facility]. Planning for ~1000 am. Will need [facility] coordinated to bring pt to [place] 0900 for procedure. NPO p MN 11/4  Ascencion Dike PA-C Interventional Radiology 03/27/2018 3:21 PM

## 2018-03-27 NOTE — Progress Notes (Signed)
Patient ID: Marvin Davis, male   DOB: March 04, 1956, 62 y.o.   MRN: [id number]   Pt was scheduled for BM Bx this am in IR at [facility]  Scheduled to be here at 730 am for this am procedure  Cytology prepared for this am  Unfortunately, at 740 am-- pt was still at [facility]  Must reschedule at this point - verified with Dr Earleen [place] Can offer poss Mon  Or can easily be scheduled as OP  I will discuss with Dr Dyann Kief today  RN aware

## 2018-03-28 LAB — CBC
HCT: 24.5 % — ABNORMAL LOW (ref 39.0–52.0)
Hemoglobin: 7.6 g/dL — ABNORMAL LOW (ref 13.0–17.0)
MCH: 27.9 pg (ref 26.0–34.0)
MCHC: 31 g/dL (ref 30.0–36.0)
MCV: 90.1 fL (ref 80.0–100.0)
PLATELETS: 39 10*3/uL — AB (ref 150–400)
RBC: 2.72 MIL/uL — AB (ref 4.22–5.81)
RDW: 17.7 % — ABNORMAL HIGH (ref 11.5–15.5)
WBC: 0.4 10*3/uL — CL (ref 4.0–10.5)
nRBC: 4.8 % — ABNORMAL HIGH (ref 0.0–0.2)

## 2018-03-28 LAB — OCCULT BLOOD X 1 CARD TO LAB, STOOL: FECAL OCCULT BLD: NEGATIVE

## 2018-03-28 LAB — CULTURE, BLOOD (ROUTINE X 2)
Culture: NO GROWTH
Culture: NO GROWTH
SPECIAL REQUESTS: ADEQUATE
SPECIAL REQUESTS: ADEQUATE

## 2018-03-28 LAB — STREP PNEUMONIAE URINARY ANTIGEN: STREP PNEUMO URINARY ANTIGEN: NEGATIVE

## 2018-03-28 NOTE — Progress Notes (Signed)
PROGRESS NOTE    Marvin Davis  WCB:[id number] DOB: [birth year] DOA: 03/23/2018 PCP: Soyla Dryer, PA-C    Brief Narrative:  62 year old male who does not seek regular medical care, with a history of COPD, presents to the hospital with progressive shortness of breath.  He had been using his nebulizers at home for wheezing without significant improvement.  On arrival to the emergency room, he was placed on BiPAP therapy for increased respiratory effort.  Chest x-ray indicated right middle lobe pneumonia.  He was started on intravenous antibiotics.  He is also noted to be pancytopenic and has been receiving PRBC transfusions for significant anemia.  Hematology consulted for pancytopenia.   Assessment & Plan:   Active Problems:   Acute respiratory failure with hypoxia (HCC)   COPD  GOLD III    Hypertension   Anemia   Thrombocytopenia (HCC)   CAP (community acquired pneumonia)   AKI (acute kidney injury) ([place])   1. Acute respiratory failure with hypoxia.  Likely related to COPD exacerbation and pneumonia.  Initially placed on BiPAP therapy but has since been weaned down to nasal cannula.  Still somewhat short of breath.  Continue current treatments and weaned O2 supplementation as tolerated. Still requiring 5L [place]. 2. Community-acquired pneumonia.  Initially on ceftriaxone and azithromycin, but has been transitioned to augmentin.  Urinary antigens have been ordered.  Blood cultures have shown no growth. 3. COPD exacerbation.  Still short of breath with conversation. Continue on bronchodilators, IV and inhaled steroids. Continue antibiotics and flutter valve. 4. Acute kidney injury.  Related to dehydration.  Improved with hydration. 5. Pancytopenia.  Etiology is unclear. B12 is in the lower range of normal.  Methylmalonic acid is normal.  Iron and ferritin are elevated. Abdominal ultrasound does not show any cirrhosis or splenomegaly.  He does report a history of alcohol use.  He was  transfused a total of 3 units of PRBC with improvement in hemoglobin.  His stools are Hemoccult negative.  Platelets have been trending back down.  He remains leukopenic.  Hematology consulted, case discussed with Dr. Delton Coombes.  He has recommended bone marrow biopsy.  Discussed with interventional radiology and plan will be to undergo biopsy on 11/4 6. Left kidney mass.  Noted on abdominal ultrasound.  Will need further imaging with MRI.  Can consider doing this Monday.   DVT prophylaxis: SCDs Code Status: Full code Family Communication: No family present Disposition Plan: discharge home once respiratory status has stabilized and bone marrow biopsy complete   Consultants:   Hematology  Procedures:   Bone marrow biopsy planned for 03/30/18  Antimicrobials:   Ceftriaxone 10/28 >11/1  Azithromycin 10/28 >11/1  augmentin 11/1   Subjective: No significant cough. No chest pain. Feels breathing is slowly improving  Objective: Vitals:   03/28/18 0900 03/28/18 1000 03/28/18 1100 03/28/18 1200  BP: 139/82 127/83    Pulse: 89 87 78 87  Resp: 18 18 19  (!) 25  Temp:    (!) 97.4 F (36.3 C)  TempSrc:    Oral  SpO2: 98% 93% 96% 97%  Weight:      Height:        Intake/Output Summary (Last 24 hours) at 03/28/2018 1406 Last data filed at 03/28/2018 1200 Gross per 24 hour  Intake 3 ml  Output 2800 ml  Net -2797 ml   Filed Weights   03/24/18 0500 03/25/18 0500 03/26/18 0447  Weight: 80.8 kg 84.2 kg 86 kg    Examination: General exam: Alert,  awake, oriented x 3 Respiratory system: mild wheeze bilaterally. Mild increased respiratory effort with conversation. Cardiovascular system:RRR. No murmurs, rubs, gallops. Gastrointestinal system: Abdomen is nondistended, soft and nontender. No organomegaly or masses felt. Normal bowel sounds heard. Central nervous system: Alert and oriented. No focal neurological deficits. Extremities: No C/C/E, +pedal pulses Skin: No rashes, lesions or  ulcers Psychiatry: Judgement and insight appear normal. Mood & affect appropriate.     Data Reviewed: I have personally reviewed following labs and imaging studies  CBC: Recent Labs  Lab 03/23/18 0921  03/24/18 0220 03/24/18 1434 03/25/18 0933 03/26/18 1351 03/27/18 0403 03/28/18 1048  WBC 1.0*   < > 0.3* 0.4* 0.4* 0.4* 0.3* 0.4*  NEUTROABS 0.3*  --  0.1*  --   --  0.3* 0.2*  --   HGB 6.0*   < > 5.9* 8.2* 8.3* 8.4* 7.5* 7.6*  HCT 18.6*   < > 18.6* 25.3* 25.7* 26.7* 23.5* 24.5*  MCV 87.3   < > 88.2 86.9 87.7 88.7 90.4 90.1  PLT 67*   < > 46* 53* 59* 60* 42* 39*   < > = values in this interval not displayed.   Basic Metabolic Panel: Recent Labs  Lab 03/23/18 0921 03/23/18 1520 03/24/18 0220 03/25/18 0933 03/26/18 1351 03/27/18 0403  NA 139  --  137 140 143 141  K 4.3  --  5.6* 4.8 4.4 4.8  CL 100  --  103 105 108 108  CO2 30  --  28 26 27 29   GLUCOSE 138*  --  169* 200* 160* 168*  BUN 25*  --  22 23 22 20   CREATININE 1.41* 1.19 0.94 0.81 0.75 0.65  CALCIUM 9.1  --  8.6* 8.7* 8.7* 8.5*   GFR: Estimated Creatinine Clearance: 95.3 mL/min (by C-G formula based on SCr of 0.65 mg/dL).   Liver Function Tests: Recent Labs  Lab 03/25/18 0933  AST 23  ALT 30  ALKPHOS 53  BILITOT 0.7  PROT 7.2  ALBUMIN 3.0*   Coagulation Profile: Recent Labs  Lab 03/26/18 1351  INR 1.36   Cardiac Enzymes: Recent Labs  Lab 03/23/18 0921 03/23/18 1520 03/23/18 1952 03/24/18 0220  TROPONINI 0.08* <0.03 <0.03 <0.03    Recent Results (from the past 240 hour(s))  Culture, blood (routine x 2) Call MD if unable to obtain prior to antibiotics being given     Status: None   Collection Time: 03/23/18 10:56 AM  Result Value Ref Range Status   Specimen Description BLOOD RIGHT ARM  Final   Special Requests   Final    BOTTLES DRAWN AEROBIC ONLY Blood Culture adequate volume   Culture   Final    NO GROWTH 5 DAYS Performed at [facility], [address] [place], [place] [address]     Report Status 03/28/2018 FINAL  Final  Culture, blood (routine x 2) Call MD if unable to obtain prior to antibiotics being given     Status: None   Collection Time: 03/23/18 11:10 AM  Result Value Ref Range Status   Specimen Description BLOOD RIGHT FOREARM  Final   Special Requests   Final    BOTTLES DRAWN AEROBIC AND ANAEROBIC Blood Culture adequate volume   Culture   Final    NO GROWTH 5 DAYS Performed at [facility], [address] [place], [place] [address]    Report Status 03/28/2018 FINAL  Final  MRSA PCR Screening     Status: None   Collection Time: 03/23/18  2:25 PM  Result Value Ref Range Status   MRSA by PCR NEGATIVE NEGATIVE Final    Comment:        The GeneXpert MRSA Assay (FDA approved for NASAL specimens only), is one component of a comprehensive MRSA colonization surveillance program. It is not intended to diagnose MRSA infection nor to guide or monitor treatment for MRSA infections. Performed at [facility], [address], [place], [place] [address]      Radiology Studies: No results found.  Scheduled Meds: . amLODipine  10 mg Oral Daily  . amoxicillin-clavulanate  1 tablet Oral Q12H  . arformoterol  15 mcg Nebulization BID  . budesonide (PULMICORT) nebulizer solution  0.5 mg Nebulization BID  . chlorhexidine  15 mL Mouth Rinse BID  . feeding supplement (ENSURE ENLIVE)  237 mL Oral BID BM  . guaiFENesin  1,200 mg Oral BID  . ipratropium-albuterol  3 mL Nebulization Q6H  . mouth rinse  15 mL Mouth Rinse q12n4p  . methylPREDNISolone (SOLU-MEDROL) injection  60 mg Intravenous Q8H  . sodium chloride flush  3 mL Intravenous Q12H   Continuous Infusions:    LOS: 5 days    Time spent: 30 minutes   Kathie Dike, MD [facility] Pager [phone]  If 7PM-7AM, please contact night-coverage [email or web address] Password [email or web address] 03/28/2018, 2:06 PM

## 2018-03-28 NOTE — Progress Notes (Signed)
Sent note through Amion that WBC was 0.4 critical level.

## 2018-03-28 NOTE — Progress Notes (Signed)
Increased oxygen to 5lpm per patients request. Stated he having trouble breathing.

## 2018-03-29 LAB — LEGIONELLA PNEUMOPHILA SEROGP 1 UR AG: L. pneumophila Serogp 1 Ur Ag: NEGATIVE

## 2018-03-29 NOTE — Progress Notes (Signed)
PROGRESS NOTE    Marvin Davis  EUM:[id number] DOB: 1956-01-27 DOA: 03/23/2018 PCP: Soyla Dryer, PA-C    Brief Narrative:  62 year old male who does not seek regular medical care, with a history of COPD, presents to the hospital with progressive shortness of breath.  He had been using his nebulizers at home for wheezing without significant improvement.  On arrival to the emergency room, he was placed on BiPAP therapy for increased respiratory effort.  Chest x-ray indicated right middle lobe pneumonia.  He was started on intravenous antibiotics.  He is also noted to be pancytopenic and has been receiving PRBC transfusions for significant anemia.  Hematology consulted for pancytopenia.   Assessment & Plan:   Active Problems:   Acute respiratory failure with hypoxia (HCC)   COPD  GOLD III    Hypertension   Anemia   Thrombocytopenia (HCC)   CAP (community acquired pneumonia)   AKI (acute kidney injury) ([place])   1. Acute respiratory failure with hypoxia.  Likely related to COPD exacerbation and pneumonia.  Initially placed on BiPAP therapy but has since been weaned down to nasal cannula.  Still somewhat short of breath.  Continue current treatments and weaned O2 supplementation as tolerated. Still requiring 4L [place]. 2. Community-acquired pneumonia.  Initially on ceftriaxone and azithromycin, but has been transitioned to augmentin orally now.  Urinary antigens have been ordered.  Blood cultures have shown no growth. 3. COPD exacerbation.  Still short of breath with conversation. Continue on bronchodilators, IV and inhaled steroids. Continue antibiotics and flutter valve. 4. Acute kidney injury.  Related to dehydration.  Improved with hydration. 5. Pancytopenia.  Etiology is unclear. B12 is in the lower range of normal.  Methylmalonic acid is normal.  Iron and ferritin are elevated. Abdominal ultrasound does not show any cirrhosis or splenomegaly.  He does report a history of alcohol  use.  He was transfused a total of 3 units of PRBC with improvement in hemoglobin.  His stools are Hemoccult negative.  Platelets have been trending back down.  He remains leukopenic.  Hematology consulted, case discussed with Dr. Delton Coombes.  He has recommended bone marrow biopsy.  Discussed with interventional radiology and plan will be to undergo biopsy on 11/4 6. Left kidney mass.  Noted on abdominal ultrasound.  Will need further imaging with MRI.  Will arrange for abd MRI tomorrow to further stratify renal mass.   DVT prophylaxis: SCDs Code Status: Full code Family Communication: No family present Disposition Plan: discharge home once respiratory status has stabilized and bone marrow biopsy completed. Will also check abd MRI tomorrow.   Consultants:   Hematology  IR: For bone marrow biopsy  Procedures:   Bone marrow biopsy planned for 03/30/18  Antimicrobials:   Ceftriaxone 10/28 >11/1  Azithromycin 10/28 >11/1  augmentin 11/1   Subjective: Feeling better. Still SOB with exertion. Coughing spells improved.  Denies chest pain, nausea, vomiting and abdominal pain.  Patient is afebrile.  Objective: Vitals:   03/29/18 0610 03/29/18 0833 03/29/18 1402 03/29/18 1412  BP: 137/88   (!) 141/81  Pulse: 77   87  Resp: (!) 22   18  Temp: (!) 97.4 F (36.3 C)   98.2 F (36.8 C)  TempSrc: Oral   Oral  SpO2: 99% 95% 96% 97%  Weight:      Height:        Intake/Output Summary (Last 24 hours) at 03/29/2018 1650 Last data filed at 03/29/2018 1443 Gross per 24 hour  Intake 726 ml  Output 2300 ml  Net -1574 ml   Filed Weights   03/24/18 0500 03/25/18 0500 03/26/18 0447  Weight: 80.8 kg 84.2 kg 86 kg    Examination: General exam: Alert, awake, oriented x 3; afebrile. Respiratory system: improved air movement, still with positive exp wheezing and SOB on exertion. Requiring 4L [place] supplementation. Respiratory effort normal. Cardiovascular system:RRR. No murmurs, rubs,  gallops. Gastrointestinal system: Abdomen is nondistended, soft and nontender. No organomegaly or masses felt. Normal bowel sounds heard. Central nervous system: Alert and oriented. No focal neurological deficits. Extremities: No C/C/E, +pedal pulses Skin: No rashes, lesions or ulcers Psychiatry: Judgement and insight appear normal. Mood & affect appropriate.   Data Reviewed: I have personally reviewed following labs and imaging studies  CBC: Recent Labs  Lab 03/23/18 0921  03/24/18 0220 03/24/18 1434 03/25/18 0933 03/26/18 1351 03/27/18 0403 03/28/18 1048  WBC 1.0*   < > 0.3* 0.4* 0.4* 0.4* 0.3* 0.4*  NEUTROABS 0.3*  --  0.1*  --   --  0.3* 0.2*  --   HGB 6.0*   < > 5.9* 8.2* 8.3* 8.4* 7.5* 7.6*  HCT 18.6*   < > 18.6* 25.3* 25.7* 26.7* 23.5* 24.5*  MCV 87.3   < > 88.2 86.9 87.7 88.7 90.4 90.1  PLT 67*   < > 46* 53* 59* 60* 42* 39*   < > = values in this interval not displayed.   Basic Metabolic Panel: Recent Labs  Lab 03/23/18 0921 03/23/18 1520 03/24/18 0220 03/25/18 0933 03/26/18 1351 03/27/18 0403  NA 139  --  137 140 143 141  K 4.3  --  5.6* 4.8 4.4 4.8  CL 100  --  103 105 108 108  CO2 30  --  _0 GLUCOSE 138*  --  169* 200* 160* 168*  BUN 25*  --  _1 CREATININE 1.41* 1.19 0.94 0.81 0.75 0.65  CALCIUM 9.1  --  8.6* 8.7* 8.7* 8.5*   GFR: Estimated Creatinine Clearance: 95.3 mL/min (by C-G formula based on SCr of 0.65 mg/dL).   Liver Function Tests: Recent Labs  Lab 03/25/18 0933  AST 23  ALT 30  ALKPHOS 53  BILITOT 0.7  PROT 7.2  ALBUMIN 3.0*   Coagulation Profile: Recent Labs  Lab 03/26/18 1351  INR 1.36   Cardiac Enzymes: Recent Labs  Lab 03/23/18 0921 03/23/18 1520 03/23/18 1952 03/24/18 0220  TROPONINI 0.08* <0.03 <0.03 <0.03    Recent Results (from the past 240 hour(s))  Culture, blood (routine x 2) Call MD if unable to obtain prior to antibiotics being given     Status: None   Collection Time: 03/23/18 10:56 AM   Result Value Ref Range Status   Specimen Description BLOOD RIGHT ARM  Final   Special Requests   Final    BOTTLES DRAWN AEROBIC ONLY Blood Culture adequate volume   Culture   Final    NO GROWTH 5 DAYS Performed at [facility], [address], [place], [place] [address]    Report Status 03/28/2018 FINAL  Final  Culture, blood (routine x 2) Call MD if unable to obtain prior to antibiotics being given     Status: None   Collection Time: 03/23/18 11:10 AM  Result Value Ref Range Status   Specimen Description BLOOD RIGHT FOREARM  Final   Special Requests   Final    BOTTLES DRAWN AEROBIC AND ANAEROBIC Blood Culture adequate volume   Culture   Final  NO GROWTH 5 DAYS Performed at [facility], [address], [place], [place] [address]    Report Status 03/28/2018 FINAL  Final  MRSA PCR Screening     Status: None   Collection Time: 03/23/18  2:25 PM  Result Value Ref Range Status   MRSA by PCR NEGATIVE NEGATIVE Final    Comment:        The GeneXpert MRSA Assay (FDA approved for NASAL specimens only), is one component of a comprehensive MRSA colonization surveillance program. It is not intended to diagnose MRSA infection nor to guide or monitor treatment for MRSA infections. Performed at [facility], [address], [place],  [address]      Radiology Studies: No results found.  Scheduled Meds: . amLODipine  10 mg Oral Daily  . amoxicillin-clavulanate  1 tablet Oral Q12H  . arformoterol  15 mcg Nebulization BID  . budesonide (PULMICORT) nebulizer solution  0.5 mg Nebulization BID  . chlorhexidine  15 mL Mouth Rinse BID  . feeding supplement (ENSURE ENLIVE)  237 mL Oral BID BM  . guaiFENesin  1,200 mg Oral BID  . ipratropium-albuterol  3 mL Nebulization Q6H  . mouth rinse  15 mL Mouth Rinse q12n4p  . methylPREDNISolone (SOLU-MEDROL) injection  60 mg Intravenous Q8H  . sodium chloride flush  3 mL Intravenous Q12H   Continuous Infusions:    LOS: 6 days    Time  spent: 30 minutes   Barton Dubois, MD [facility] Pager [phone] If 7PM-7AM, please contact night-coverage [email or web address] Password [email or web address] 03/29/2018, 4:50 PM

## 2018-03-30 ENCOUNTER — Inpatient Hospital Stay (HOSPITAL_COMMUNITY): Payer: Medicaid Other

## 2018-03-30 DIAGNOSIS — D696 Thrombocytopenia, unspecified: Secondary | ICD-10-CM

## 2018-03-30 DIAGNOSIS — C642 Malignant neoplasm of left kidney, except renal pelvis: Secondary | ICD-10-CM

## 2018-03-30 DIAGNOSIS — D649 Anemia, unspecified: Secondary | ICD-10-CM

## 2018-03-30 DIAGNOSIS — I10 Essential (primary) hypertension: Secondary | ICD-10-CM

## 2018-03-30 LAB — CREATININE, SERUM: CREATININE: 0.55 mg/dL — AB (ref 0.61–1.24)

## 2018-03-30 LAB — CBC WITH DIFFERENTIAL/PLATELET
ABS IMMATURE GRANULOCYTES: 0.02 10*3/uL (ref 0.00–0.07)
BASOS PCT: 0 %
Basophils Absolute: 0 10*3/uL (ref 0.0–0.1)
EOS ABS: 0 10*3/uL (ref 0.0–0.5)
EOS PCT: 0 %
HCT: 23.5 % — ABNORMAL LOW (ref 39.0–52.0)
Hemoglobin: 7.3 g/dL — ABNORMAL LOW (ref 13.0–17.0)
Immature Granulocytes: 5 %
Lymphocytes Relative: 15 %
Lymphs Abs: 0.1 10*3/uL — ABNORMAL LOW (ref 0.7–4.0)
MCH: 27.9 pg (ref 26.0–34.0)
MCHC: 31.1 g/dL (ref 30.0–36.0)
MCV: 89.7 fL (ref 80.0–100.0)
MONO ABS: 0 10*3/uL — AB (ref 0.1–1.0)
MONOS PCT: 3 %
NEUTROS ABS: 0.3 10*3/uL — AB (ref 1.7–7.7)
Neutrophils Relative %: 77 %
Platelets: 29 10*3/uL — CL (ref 150–400)
RBC: 2.62 MIL/uL — AB (ref 4.22–5.81)
RDW: 17.3 % — ABNORMAL HIGH (ref 11.5–15.5)
WBC: 0.4 10*3/uL — AB (ref 4.0–10.5)
nRBC: 0 % (ref 0.0–0.2)

## 2018-03-30 MED ORDER — METHYLPREDNISOLONE SODIUM SUCC 125 MG IJ SOLR
60.0000 mg | Freq: Two times a day (BID) | INTRAMUSCULAR | Status: DC
  Administered 2018-03-30: 60 mg via INTRAVENOUS
  Filled 2018-03-30: qty 2

## 2018-03-30 MED ORDER — GADOBUTROL 1 MMOL/ML IV SOLN
8.0000 mL | Freq: Once | INTRAVENOUS | Status: AC | PRN
  Administered 2018-03-30: 8 mL via INTRAVENOUS

## 2018-03-30 NOTE — Progress Notes (Signed)
Pt not in room for nebulizer treatment, will try again later

## 2018-03-30 NOTE — Progress Notes (Signed)
Patient ID: [name], male   DOB: July 27, 1955, 62 y.o.   MRN: [id number]   Again pt has not been transported to [facility] on time for Bone marrow biopsy scheduled with Cytology at 1000 am today.  RN has placed note in chart  Dr Dyann Kief can determine next step  Pleas let us know if need [place].

## 2018-03-30 NOTE — Progress Notes (Signed)
Called to [facility] to see if pt has been picked up by [facility] for transport to [facility] Radiology for BM Bx. Secretary states that pt is still in room and has not been picked up by [facility] for transport. Advised that we have lost time window for transportation and cytology availability for biopsy to be done today. Rad Pa aware.

## 2018-03-30 NOTE — Progress Notes (Signed)
PROGRESS NOTE    Marvin Davis  JOI:[id number] DOB: [birth year] DOA: 03/23/2018 PCP: Soyla Dryer, PA-C    Brief Narrative:  62 year old male who does not seek regular medical care, with a history of COPD, presents to the hospital with progressive shortness of breath.  He had been using his nebulizers at home for wheezing without significant improvement.  On arrival to the emergency room, he was placed on BiPAP therapy for increased respiratory effort.  Chest x-ray indicated right middle lobe pneumonia.  He was started on intravenous antibiotics.  He is also noted to be pancytopenic and has been receiving PRBC transfusions for significant anemia.  Hematology consulted for pancytopenia.   Assessment & Plan:   Active Problems:   Acute respiratory failure with hypoxia (HCC)   COPD  GOLD III    Hypertension   Anemia   Thrombocytopenia (HCC)   CAP (community acquired pneumonia)   AKI (acute kidney injury) ([place])   1. Acute respiratory failure with hypoxia.  Likely related to COPD exacerbation and pneumonia.  Initially placed on BiPAP therapy but has since been weaned down to nasal cannula.  Still somewhat short of breath with long conversations and physical exertion.  Continue current treatments and weaned O2 supplementation as tolerated. Still requiring 2-3 L [place]. 2. Community-acquired pneumonia.  Initially on ceftriaxone and azithromycin, but has been transitioned to augmentin orally now; 2 more days of antibiotics pending to complete therapy. Urinary antigens and Blood cultures have shown no growth. 3. COPD exacerbation.  Still short of breath with exertion; almost able to speak in full sentences. Requiring less oxygen supplementation. Continue on bronchodilators, IV and inhaled steroids. Continue antibiotics and flutter valve. Patient is 2 days out of finishing abx's and has initiated steroids tapering.  4. Acute kidney injury.  Related to dehydration.  Improved with  hydration. 5. Pancytopenia.  Etiology is unclear. B12 is in the lower range of normal.  Methylmalonic acid is normal.  Iron and ferritin are elevated. Abdominal ultrasound does not show any cirrhosis or splenomegaly.  He does report a history of alcohol use.  He was transfused a total of 3 units of PRBC with improvement in hemoglobin.  His stools are Hemoccult negative.  Platelets have been trending back down.  He remains leukopenic.  Hematology consulted, case discussed with Dr. Delton Coombes.  He has recommended bone marrow biopsy. Patient would be transfer to [facility] for bone marrow biopsy. 6. Left kidney mass.  Noted on abdominal ultrasound.  MRI demonstrating Bosniak class 3 lesion. No metastasis or involvement of left renal vein appreciated. Discussed with Urology service and patient will need outpatient follow up to determine definitive management of renal malignancy.  7.   DVT prophylaxis: SCDs Code Status: Full code Family Communication: No family present Disposition Plan: discharge home once respiratory status has stabilized and bone marrow biopsy completed.    Consultants:   Hematology  IR: For bone marrow biopsy  Urology (Dr. Tresa Moore)   Procedures:   Bone marrow biopsy planned for 03/30/18  Antimicrobials:   Ceftriaxone 10/28 >11/1  Azithromycin 10/28 >11/1  augmentin 11/1   Subjective: Feeling much better.  Still having some shortness of breath with exertion.  No nausea, no vomiting, no chest pain, patient is afebrile.  Objective: Vitals:   03/30/18 0935 03/30/18 0937 03/30/18 1315 03/30/18 1449  BP:   140/80   Pulse:   (!) 101   Resp:   18   Temp:   97.9 F (36.6 C)   TempSrc:  Oral   SpO2: 99% 98% 93% 94%  Weight:      Height:        Intake/Output Summary (Last 24 hours) at 03/30/2018 1806 Last data filed at 03/30/2018 1800 Gross per 24 hour  Intake 603 ml  Output 3200 ml  Net -2597 ml   Filed Weights   03/24/18 0500 03/25/18 0500 03/26/18 0447  Weight:  80.8 kg 84.2 kg 86 kg    Examination: General exam: Alert, awake, oriented x 3; denies chest pain, afebrile, feeling much better and breathing easier.  Patient was almost able to speak in full sentences and is reporting just mild shortness of breath with exertion. Respiratory system: Positive expiratory wheezing, scattered rhonchi.  Using 2-3 L nasal cannula supplementation.  Normal respiratory effort.  Cardiovascular system:RRR. No murmurs, rubs, gallops. Gastrointestinal system: Abdomen is nondistended, soft and nontender. No organomegaly or masses felt. Normal bowel sounds heard. Central nervous system: Alert and oriented. No focal neurological deficits. Extremities: No C/C/E, +pedal pulses Skin: No rashes, lesions or ulcers Psychiatry: Judgement and insight appear normal. Mood & affect appropriate.    Data Reviewed: I have personally reviewed following labs and imaging studies  CBC: Recent Labs  Lab 03/24/18 0220  03/25/18 0933 03/26/18 1351 03/27/18 0403 03/28/18 1048 03/30/18 0504  WBC 0.3*   < > 0.4* 0.4* 0.3* 0.4* 0.4*  NEUTROABS 0.1*  --   --  0.3* 0.2*  --  0.3*  HGB 5.9*   < > 8.3* 8.4* 7.5* 7.6* 7.3*  HCT 18.6*   < > 25.7* 26.7* 23.5* 24.5* 23.5*  MCV 88.2   < > 87.7 88.7 90.4 90.1 89.7  PLT 46*   < > 59* 60* 42* 39* 29*   < > = values in this interval not displayed.   Basic Metabolic Panel: Recent Labs  Lab 03/24/18 0220 03/25/18 0933 03/26/18 1351 03/27/18 0403 03/30/18 0504  NA 137 140 143 141  --   K 5.6* 4.8 4.4 4.8  --   CL 103 105 108 108  --   CO2 28 26 27 29   --   GLUCOSE 169* 200* 160* 168*  --   BUN 22 23 22 20   --   CREATININE 0.94 0.81 0.75 0.65 0.55*  CALCIUM 8.6* 8.7* 8.7* 8.5*  --    GFR: Estimated Creatinine Clearance: 95.3 mL/min (A) (by C-G formula based on SCr of 0.55 mg/dL (L)).   Liver Function Tests: Recent Labs  Lab 03/25/18 0933  AST 23  ALT 30  ALKPHOS 53  BILITOT 0.7  PROT 7.2  ALBUMIN 3.0*   Coagulation  Profile: Recent Labs  Lab 03/26/18 1351  INR 1.36   Cardiac Enzymes: Recent Labs  Lab 03/23/18 1952 03/24/18 0220  TROPONINI <0.03 <0.03    Recent Results (from the past 240 hour(s))  Culture, blood (routine x 2) Call MD if unable to obtain prior to antibiotics being given     Status: None   Collection Time: 03/23/18 10:56 AM  Result Value Ref Range Status   Specimen Description BLOOD RIGHT ARM  Final   Special Requests   Final    BOTTLES DRAWN AEROBIC ONLY Blood Culture adequate volume   Culture   Final    NO GROWTH 5 DAYS Performed at [facility], [address], [place], [place] [address]    Report Status 03/28/2018 FINAL  Final  Culture, blood (routine x 2) Call MD if unable to obtain prior to antibiotics being given  Status: None   Collection Time: 03/23/18 11:10 AM  Result Value Ref Range Status   Specimen Description BLOOD RIGHT FOREARM  Final   Special Requests   Final    BOTTLES DRAWN AEROBIC AND ANAEROBIC Blood Culture adequate volume   Culture   Final    NO GROWTH 5 DAYS Performed at [facility], [address], [place], [place] [address]    Report Status 03/28/2018 FINAL  Final  MRSA PCR Screening     Status: None   Collection Time: 03/23/18  2:25 PM  Result Value Ref Range Status   MRSA by PCR NEGATIVE NEGATIVE Final    Comment:        The GeneXpert MRSA Assay (FDA approved for NASAL specimens only), is one component of a comprehensive MRSA colonization surveillance program. It is not intended to diagnose MRSA infection nor to guide or monitor treatment for MRSA infections. Performed at [facility], [address] [place],  [address]      Radiology Studies: Mr Abdomen W Wo Contrast  Result Date: 03/30/2018 CLINICAL DATA:  62 year old male with history of left-sided renal mass identified by recent ultrasound examination. Follow-up study for characterization. EXAM: MRI ABDOMEN WITHOUT AND WITH CONTRAST TECHNIQUE: Multiplanar  multisequence MR imaging of the abdomen was performed both before and after the administration of intravenous contrast. CONTRAST:  8 mL of Gadavist. COMPARISON:  No prior abdominal MRI. Abdominal ultrasound 03/24/2018. FINDINGS: Lower chest: Small right pleural effusion lying dependently. Trace left pleural effusion lying dependently. Hepatobiliary: No suspicious cystic or solid hepatic lesions. No intra or extrahepatic biliary ductal dilatation. Gallbladder is normal in appearance. Pancreas: No pancreatic mass. No pancreatic ductal dilatation. No pancreatic or peripancreatic fluid or inflammatory changes. Spleen:  Unremarkable. Adrenals/Urinary Tract: In the left kidney involving predominantly the anterior aspect of the lower pole there is a large mass measuring 8.5 x 7.4 x 9.2 cm (axial images 56 and 62 of series 21 and coronal image 56 of series 23) which demonstrates very heterogeneous internal signal characteristics with areas of both T1 and T2 hyperintensity, with several other areas that are far more heterogeneous in signal characteristics. On T2 weighted images along the posterior margin of the lesion there is some T2 hypointense material (axial image 23 of series 5) which appears to correspond to some enhancement on post gadolinium subtraction images (axial image 50 of series 7). Additional areas of internal low level enhancement may also be present, although this is difficult to say for certain, as this subtle perceived enhancement could simply reflect misregistration artifact on subtraction imaging. At this time, the lesion appears separate from the left renal vein. This lesion appears to be encapsulated within Gerota's fascia. In the medial aspect of the lower pole of the right kidney there is a 1.8 cm T1 hypointense, T2 hyperintense, nonenhancing lesion, compatible with a small simple cyst. Bilateral adrenal glands are normal in appearance. No hydroureteronephrosis in the visualized portions of the  abdomen. Stomach/Bowel: Visualized portions are unremarkable. Vascular/Lymphatic: No aneurysm identified in the visualized abdominal vasculature no definite neovascularity associated with the left renal lesion. No lymphadenopathy identified in the abdomen. Other: No significant volume of ascites noted in the visualized portions of the peritoneal cavity. Musculoskeletal: No aggressive appearing osseous lesions are noted in the visualized portions of the skeleton. IMPRESSION: 1. Complex left renal mass measuring approximately 8.5 x 7.4 x 9.2 cm with evidence of internal hemorrhage and small amount of perceivable mural enhancement along the posterior margin of the lesion  with additional areas of potential low-level progressive enhancement internally. 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 At this time, the lesion appears encapsulated within Gerota's fascia, does not involve the left renal vein, and there is no associated lymphadenopathy or definite signs of metastatic disease in the abdomen. Urologic consultation is recommended. 2. Small right pleural effusion lying dependently. If there is any clinical concern for metastatic disease to the chest, further evaluation with nonemergent chest CT should be considered in the near future. These results will be called to the ordering clinician or representative by the Radiologist Assistant, and communication documented in the PACS or zVision Dashboard. Electronically Signed   By: Vinnie Langton M.D.   On: 03/30/2018 08:45    Scheduled Meds: . amLODipine  10 mg Oral Daily  . amoxicillin-clavulanate  1 tablet Oral Q12H  . arformoterol  15 mcg Nebulization BID  . budesonide (PULMICORT) nebulizer solution  0.5 mg Nebulization BID  . chlorhexidine  15 mL Mouth Rinse BID  . feeding supplement (ENSURE ENLIVE)  237 mL Oral BID BM  . guaiFENesin  1,200 mg Oral BID  . ipratropium-albuterol  3 mL Nebulization Q6H  . mouth rinse  15 mL Mouth Rinse  q12n4p  . methylPREDNISolone (SOLU-MEDROL) injection  60 mg Intravenous Q12H  . sodium chloride flush  3 mL Intravenous Q12H   Continuous Infusions:    LOS: 7 days    Time spent: 30 minutes   Barton Dubois, MD [facility] Pager [phone] If 7PM-7AM, please contact night-coverage [email or web address] Password [email or web address] 03/30/2018, 6:06 PM

## 2018-03-31 ENCOUNTER — Inpatient Hospital Stay (HOSPITAL_COMMUNITY): Payer: Medicaid Other

## 2018-03-31 DIAGNOSIS — N179 Acute kidney failure, unspecified: Secondary | ICD-10-CM

## 2018-03-31 DIAGNOSIS — J9601 Acute respiratory failure with hypoxia: Secondary | ICD-10-CM

## 2018-03-31 LAB — CBC WITH DIFFERENTIAL/PLATELET
Abs Immature Granulocytes: 0.05 10*3/uL (ref 0.00–0.07)
BASOS ABS: 0 10*3/uL (ref 0.0–0.1)
BASOS PCT: 0 %
Eosinophils Absolute: 0 10*3/uL (ref 0.0–0.5)
Eosinophils Relative: 0 %
HCT: 26.4 % — ABNORMAL LOW (ref 39.0–52.0)
Hemoglobin: 8.2 g/dL — ABNORMAL LOW (ref 13.0–17.0)
IMMATURE GRANULOCYTES: 9 %
LYMPHS ABS: 0.1 10*3/uL — AB (ref 0.7–4.0)
Lymphocytes Relative: 15 %
MCH: 27.9 pg (ref 26.0–34.0)
MCHC: 31.1 g/dL (ref 30.0–36.0)
MCV: 89.8 fL (ref 80.0–100.0)
Monocytes Absolute: 0 10*3/uL — ABNORMAL LOW (ref 0.1–1.0)
Monocytes Relative: 2 %
NEUTROS PCT: 74 %
NRBC: 0 % (ref 0.0–0.2)
Neutro Abs: 0.4 10*3/uL — ABNORMAL LOW (ref 1.7–7.7)
PLATELETS: 28 10*3/uL — AB (ref 150–400)
RBC: 2.94 MIL/uL — ABNORMAL LOW (ref 4.22–5.81)
RDW: 17.2 % — AB (ref 11.5–15.5)
WBC: 0.5 10*3/uL — CL (ref 4.0–10.5)

## 2018-03-31 LAB — PROTIME-INR
INR: 1.38
Prothrombin Time: 16.8 seconds — ABNORMAL HIGH (ref 11.4–15.2)

## 2018-03-31 MED ORDER — LIDOCAINE HCL 1 % IJ SOLN
INTRAMUSCULAR | Status: AC
  Filled 2018-03-31: qty 20

## 2018-03-31 MED ORDER — FENTANYL CITRATE (PF) 100 MCG/2ML IJ SOLN
INTRAMUSCULAR | Status: AC
  Filled 2018-03-31: qty 4

## 2018-03-31 MED ORDER — IPRATROPIUM-ALBUTEROL 0.5-2.5 (3) MG/3ML IN SOLN
3.0000 mL | Freq: Three times a day (TID) | RESPIRATORY_TRACT | Status: DC
  Administered 2018-03-31 – 2018-04-01 (×3): 3 mL via RESPIRATORY_TRACT
  Filled 2018-03-31 (×3): qty 3

## 2018-03-31 MED ORDER — FENTANYL CITRATE (PF) 100 MCG/2ML IJ SOLN
INTRAMUSCULAR | Status: AC | PRN
  Administered 2018-03-31 (×2): 50 ug via INTRAVENOUS

## 2018-03-31 MED ORDER — SODIUM CHLORIDE 0.9 % IV SOLN: INTRAVENOUS | Status: DC

## 2018-03-31 MED ORDER — PREDNISONE 20 MG PO TABS
40.0000 mg | ORAL_TABLET | Freq: Every day | ORAL | Status: DC
  Administered 2018-04-01: 40 mg via ORAL
  Filled 2018-03-31: qty 2

## 2018-03-31 MED ORDER — MIDAZOLAM HCL 2 MG/2ML IJ SOLN
INTRAMUSCULAR | Status: AC | PRN
  Administered 2018-03-31 (×2): 1 mg via INTRAVENOUS

## 2018-03-31 MED ORDER — MIDAZOLAM HCL 2 MG/2ML IJ SOLN
INTRAMUSCULAR | Status: AC
  Filled 2018-03-31: qty 4

## 2018-03-31 MED ORDER — METHYLPREDNISOLONE SODIUM SUCC 40 MG IJ SOLR
40.0000 mg | Freq: Two times a day (BID) | INTRAMUSCULAR | Status: DC
  Administered 2018-03-31: 40 mg via INTRAVENOUS
  Filled 2018-03-31: qty 1

## 2018-03-31 NOTE — Progress Notes (Signed)
PT Cancellation Note  Patient Details Name: Marvin Davis MRN: [id number] DOB: 1955/06/09   Cancelled Treatment:    Reason Eval/Treat Not Completed: Patient at procedure or test/unavailable.  Pt having bone bx completed.  PT to check back later today or tomorrow as time allows.  Thanks,  Barbarann Ehlers. Tangie Stay, PT, DPT  Acute Rehabilitation [phone] pager [phone]) [phone] office

## 2018-03-31 NOTE — Care Management Note (Addendum)
Case Management Note  Patient Details  Name: Marvin Davis MRN: [id number] Date of Birth: 04/11/1956  Subjective/Objective:                  Previous Case Manager note Acute respiratory failure. From home. Independent. Walks with cane. Has continuous oxygen at 2 liters provided by [place], (pays out of pocket).  He reports his concentrator is making a noise, CM will ask Vaughan Basta of [facility] to investigate. Patient recommended for home health PT, he does not want this as he does not have insurance. Will inquire about cost of charity for patient.  11/6 Tomi Bamberger RN, BSN- Per Butch Penny with [facility] , patient looks eligible for charity for HHPT.                      Action/Plan: NCM will follow for transition of care needs.  Expected Discharge Date:                  Expected Discharge Plan:  [place]  In-House Referral:  Financial Counselor  Discharge planning Services  CM Consult  Post Acute Care Choice:  Home Health Choice offered to:  Patient  DME Arranged:    DME Agency:     HH Arranged:  PT [place]:  [place]  Status of Service:  Completed, signed off  If discussed at [place] of Stay Meetings, dates discussed:    Additional Comments: 11/5 Tomi Bamberger RN, BSN - s/p CT guided aspirate and core bx of right posterior iliac bone by IR today.  NCM contacted Butch Penny with [facility] to check on charity services for patient for HHPT.   Zenon Mayo, RN 03/31/2018, 10:47 AM

## 2018-03-31 NOTE — Progress Notes (Signed)
PROGRESS NOTE  NUSSEN PULLIN BJY:[id number] DOB: 16-Oct-1955 DOA: 03/23/2018 PCP: Soyla Dryer, PA-C   LOS: 8 days   Brief Narrative / Interim history: 62 year old male who does not follow up with MDs regularly, history of COPD with history of tobacco abuse quit 3 years ago who was admitted to any pain hospital on 03/23/2018 with progressive shortness of breath.  He was diagnosed with COPD exacerbation and pneumonia.  On admission he was on BiPAP, now weaned off to nasal cannula.  In addition, he was diagnosed with pancytopenia over several weeks, and hematology was consulted.  Recommended bone marrow biopsy for which she was transferred to [facility] from [facility], underwent IR guided bone marrow biopsy on 11/5.  Subjective: -States that his breathing overall is improved since admission however feels still short of breath, has audible wheezing.  No chest pain, no palpitations  Assessment & Plan: Active Problems:   Acute respiratory failure with hypoxia (HCC)   COPD  GOLD III    Hypertension   Anemia   Thrombocytopenia (HCC)   CAP (community acquired pneumonia)   AKI (acute kidney injury) ([place])   Acute on chronic respiratory failure with hypoxia in the setting of COPD exacerbation and pneumonia -Severe initially requiring BiPAP, now weaned off to 3 L nasal cannula.  He has chronic oxygen at home on 2 L -Continue Pulmicort, continue duo nebs, will transition Solu-Medrol to prednisone -Still with significant wheezing  Community-acquired pneumonia -She was initially on ceftriaxone and azithromycin, has been transitioned to Augmentin and has completed a 9-day course.  COPD exacerbation -Improving however still wheezing and shortness of breath.  Continue nebulizers and steroids.  Continue flutter valve.  Acute kidney injury -improved with hydration  Pancytopenia -Hematology was consulted, unclear etiology.  B12 is in the low range of normal however MMA is  normal.  Iron and ferritin are elevated.  He does not have any cirrhosis or splenomegaly on abdominal ultrasound.  No history of alcohol.  He did require 2 units of packed red blood cells with improvement in hemoglobin and has remained stable since.  He has no evidence of a GI bleed with negative Hemoccult.  His platelets have been trending down but seems to have stabilized in the last couple of days.  He remains leukopenic.  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  Dr. Dyann Kief had discussed with urology and recommended outpatient follow-up   Scheduled Meds: . amLODipine  10 mg Oral Daily  . arformoterol  15 mcg Nebulization BID  . budesonide (PULMICORT) nebulizer solution  0.5 mg Nebulization BID  . chlorhexidine  15 mL Mouth Rinse BID  . feeding supplement (ENSURE ENLIVE)  237 mL Oral BID BM  . fentaNYL      . guaiFENesin  1,200 mg Oral BID  . ipratropium-albuterol  3 mL Nebulization TID  . lidocaine      . mouth rinse  15 mL Mouth Rinse q12n4p  . methylPREDNISolone (SOLU-MEDROL) injection  40 mg Intravenous Q12H  . midazolam      . sodium chloride flush  3 mL Intravenous Q12H   Continuous Infusions: PRN Meds:.acetaminophen, albuterol, HYDROcodone-acetaminophen, zolpidem  DVT prophylaxis: SCDs Code Status: Full code Family  Communication: No family present at bedside Disposition Plan: Discharge home 1 to 2 days based on respiratory status  Consultants:   Hematology  IR  Urology (Dr. Tresa Moore)  Procedures:   Bone marrow biopsy 11/5  Antimicrobials:  Ceftriaxone 10/28 >> 11/1  Azithromycin 10/28 >> 11/1  Augmentin 11/1 >> 11/5  Objective: Vitals:   03/31/18 0940 03/31/18 0945  03/31/18 0950 03/31/18 0955  BP: (!) 139/97 (!) 137/94 (!) 142/96 (!) 140/98  Pulse: 88 87 88 88  Resp: 16 16 16 16   Temp:      TempSrc:      SpO2: 100% 100% 100% 98%  Weight:      Height:        Intake/Output Summary (Last 24 hours) at 03/31/2018 1344 Last data filed at 03/31/2018 1248 Gross per 24 hour  Intake 1080 ml  Output 3400 ml  Net -2320 ml   Filed Weights   03/25/18 0500 03/26/18 0447 03/31/18 0100  Weight: 84.2 kg 86 kg 86.3 kg    Examination:  Constitutional: Slightly tachypneic Eyes: PERRL, lids and conjunctivae normal ENMT: Mucous membranes are moist.  Neck: normal, supple Respiratory: Diffuse bilateral wheezing, no crackles heard.  Increased expiratory effort Cardiovascular: Regular rate and rhythm, no murmurs / rubs / gallops. No LE edema. 2+ pedal pulses. No carotid bruits.  Abdomen: no tenderness. Bowel sounds positive.  Skin: no rashes seen Neurologic: Nonfocal Psychiatric: Normal judgment and insight. Alert and oriented x 3. Normal mood.    Data Reviewed: I have independently reviewed following labs and imaging studies   CBC: Recent Labs  Lab 03/26/18 1351 03/27/18 0403 03/28/18 1048 03/30/18 0504 03/31/18 1105  WBC 0.4* 0.3* 0.4* 0.4* 0.5*  NEUTROABS 0.3* 0.2*  --  0.3* 0.4*  HGB 8.4* 7.5* 7.6* 7.3* 8.2*  HCT 26.7* 23.5* 24.5* 23.5* 26.4*  MCV 88.7 90.4 90.1 89.7 89.8  PLT 60* 42* 39* 29* 28*   Basic Metabolic Panel: Recent Labs  Lab 03/25/18 0933 03/26/18 1351 03/27/18 0403 03/30/18 0504  NA 140 143 141  --   K 4.8 4.4 4.8  --   CL 105 108 108  --   CO2 26 27 29   --   GLUCOSE 200* 160* 168*  --   BUN 23 22 20   --   CREATININE 0.81 0.75 0.65 0.55*  CALCIUM 8.7* 8.7* 8.5*  --    GFR: Estimated Creatinine Clearance: 95.4 mL/min (A) (by C-G formula based on SCr of 0.55 mg/dL (L)). Liver Function Tests: Recent Labs  Lab 03/25/18 0933  AST 23  ALT 30  ALKPHOS 53  BILITOT 0.7  PROT 7.2  ALBUMIN 3.0*   No results for  input(s): LIPASE, AMYLASE in the last 168 hours. No results for input(s): AMMONIA in the last 168 hours. Coagulation Profile: Recent Labs  Lab 03/26/18 1351 03/31/18 1105  INR 1.36 1.38   Cardiac Enzymes: No results for input(s): CKTOTAL, CKMB, CKMBINDEX, TROPONINI in the last 168 hours. BNP (last 3 results) No results for input(s): PROBNP in the last 8760 hours. HbA1C: No results for input(s): HGBA1C in the last 72 hours. CBG: No results for input(s): GLUCAP in the last 168 hours. Lipid Profile: No results for input(s): CHOL, HDL, LDLCALC, TRIG, CHOLHDL, LDLDIRECT in the last 72 hours. Thyroid Function Tests: No results for input(s): TSH, T4TOTAL, FREET4, T3FREE, THYROIDAB in the last 72 hours. Anemia Panel: No results for input(s): VITAMINB12, FOLATE, FERRITIN, TIBC, IRON, RETICCTPCT in the last 72 hours. Urine analysis:  Component Value Date/Time   BILIRUBINUR NEG 09/29/2017 0928   PROTEINUR TRACE 09/29/2017 0928   UROBILINOGEN 0.2 09/29/2017 0928   NITRITE NEG 09/29/2017 0928   LEUKOCYTESUR Negative 09/29/2017 0928   Sepsis Labs: Invalid input(s): PROCALCITONIN, LACTICIDVEN  Recent Results (from the past 240 hour(s))  Culture, blood (routine x 2) Call MD if unable to obtain prior to antibiotics being given     Status: None   Collection Time: 03/23/18 10:56 AM  Result Value Ref Range Status   Specimen Description BLOOD RIGHT ARM  Final   Special Requests   Final    BOTTLES DRAWN AEROBIC ONLY Blood Culture adequate volume   Culture   Final    NO GROWTH 5 DAYS Performed at [facility], [address], [place], [place] [address]    Report Status 03/28/2018 FINAL  Final  Culture, blood (routine x 2) Call MD if unable to obtain prior to antibiotics being given     Status: None   Collection Time: 03/23/18 11:10 AM  Result Value Ref Range Status   Specimen Description BLOOD RIGHT FOREARM  Final   Special Requests   Final    BOTTLES DRAWN AEROBIC AND ANAEROBIC Blood  Culture adequate volume   Culture   Final    NO GROWTH 5 DAYS Performed at [facility], [address] [place], [place] [address]    Report Status 03/28/2018 FINAL  Final  MRSA PCR Screening     Status: None   Collection Time: 03/23/18  2:25 PM  Result Value Ref Range Status   MRSA by PCR NEGATIVE NEGATIVE Final    Comment:        The GeneXpert MRSA Assay (FDA approved for NASAL specimens only), is one component of a comprehensive MRSA colonization surveillance program. It is not intended to diagnose MRSA infection nor to guide or monitor treatment for MRSA infections. Performed at [facility], [address] [place],  [address]       Radiology Studies: Mr Abdomen W Wo Contrast  Result Date: 03/30/2018 CLINICAL DATA:  62 year old male with history of left-sided renal mass identified by recent ultrasound examination. Follow-up study for characterization. EXAM: MRI ABDOMEN WITHOUT AND WITH CONTRAST TECHNIQUE: Multiplanar multisequence MR imaging of the abdomen was performed both before and after the administration of intravenous contrast. CONTRAST:  8 mL of Gadavist. COMPARISON:  No prior abdominal MRI. Abdominal ultrasound 03/24/2018. FINDINGS: Lower chest: Small right pleural effusion lying dependently. Trace left pleural effusion lying dependently. Hepatobiliary: No suspicious cystic or solid hepatic lesions. No intra or extrahepatic biliary ductal dilatation. Gallbladder is normal in appearance. Pancreas: No pancreatic mass. No pancreatic ductal dilatation. No pancreatic or peripancreatic fluid or inflammatory changes. Spleen:  Unremarkable. Adrenals/Urinary Tract: In the left kidney involving predominantly the anterior aspect of the lower pole there is a large mass measuring 8.5 x 7.4 x 9.2 cm (axial images 56 and 62 of series 21 and coronal image 56 of series 23) which demonstrates very heterogeneous internal signal characteristics with areas of both T1 and T2 hyperintensity,  with several other areas that are far more heterogeneous in signal characteristics. On T2 weighted images along the posterior margin of the lesion there is some T2 hypointense material (axial image 23 of series 5) which appears to correspond to some enhancement on post gadolinium subtraction images (axial image 50 of series 7). Additional areas of internal low level enhancement may also be present, although this is difficult to say for certain, as this subtle perceived  enhancement could simply reflect misregistration artifact on subtraction imaging. At this time, the lesion appears separate from the left renal vein. This lesion appears to be encapsulated within Gerota's fascia. In the medial aspect of the lower pole of the right kidney there is a 1.8 cm T1 hypointense, T2 hyperintense, nonenhancing lesion, compatible with a small simple cyst. Bilateral adrenal glands are normal in appearance. No hydroureteronephrosis in the visualized portions of the abdomen. Stomach/Bowel: Visualized portions are unremarkable. Vascular/Lymphatic: No aneurysm identified in the visualized abdominal vasculature no definite neovascularity associated with the left renal lesion. No lymphadenopathy identified in the abdomen. Other: No significant volume of ascites noted in the visualized portions of the peritoneal cavity. Musculoskeletal: No aggressive appearing osseous lesions are noted in the visualized portions of the skeleton. IMPRESSION: 1. Complex left renal mass measuring approximately 8.5 x 7.4 x 9.2 cm with evidence of internal hemorrhage and small amount of perceivable mural enhancement along the posterior margin of the lesion with additional areas of potential low-level progressive enhancement internally. 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 At this time, the lesion appears encapsulated within Gerota's fascia, does not involve the left renal vein, and there is no associated lymphadenopathy or  definite signs of metastatic disease in the abdomen. Urologic consultation is recommended. 2. Small right pleural effusion lying dependently. If there is any clinical concern for metastatic disease to the chest, further evaluation with nonemergent chest CT should be considered in the near future. These results will be called to the ordering clinician or representative by the Radiologist Assistant, and communication documented in the PACS or zVision Dashboard. Electronically Signed   By: Vinnie Langton M.D.   On: 03/30/2018 08:45   Ct Bone Marrow Biopsy & Aspiration  Result Date: 03/31/2018 INDICATION: 62 year old male with a history of pancytopenia EXAM: CT BONE MARROW BIOPSY AND ASPIRATION MEDICATIONS: None. ANESTHESIA/SEDATION: Moderate (conscious) sedation was employed during this procedure. A total of Versed 2.0 mg and Fentanyl 100 mcg was administered intravenously. Moderate Sedation Time: 10 minutes. The patient's level of consciousness and vital signs were monitored continuously by radiology nursing throughout the procedure under my direct supervision. FLUOROSCOPY TIME:  CT COMPLICATIONS: None PROCEDURE: The procedure risks, benefits, and alternatives were explained to the patient. Questions regarding the procedure were encouraged and answered. The patient understands and consents to the procedure. Scout CT of the pelvis was performed for surgical planning purposes. The posterior pelvis was prepped with Chlorhexidine in a sterile fashion, and a sterile drape was applied covering the operative field. A sterile gown and sterile gloves were used for the procedure. Local anesthesia was provided with 1% Lidocaine. Posterior iliac bone was targeted for biopsy. The skin and subcutaneous tissues were infiltrated with 1% lidocaine without epinephrine. A small stab incision was made with an 11 blade scalpel, and an 11 gauge Murphy needle was advanced with CT guidance to the posterior cortex. Manual forced was  used to advance the needle through the posterior cortex and the stylet was removed. A bone marrow aspirate was retrieved and passed to a cytotechnologist in the room. The Murphy needle was then advanced without the stylet for a core biopsy. The core biopsy was retrieved and also passed to a cytotechnologist. Manual pressure was used for hemostasis and a sterile dressing was placed. No complications were encountered no significant blood loss was encountered. Patient tolerated the procedure well and remained hemodynamically stable throughout. IMPRESSION: Status post CT-guided bone marrow biopsy, with tissue specimen sent  to pathology for complete histopathologic analysis Signed, Dulcy Fanny. Earleen Newport, DO [facility] Radiology Electronically Signed   By: Corrie Mckusick D.O.   On: 03/31/2018 10:02     Marzetta Board, MD, PhD [facility] Pager [phone]  If 7PM-7AM, please contact night-coverage [email or web address] Password [email or web address] 03/31/2018, 1:44 PM

## 2018-03-31 NOTE — Procedures (Signed)
Interventional Radiology Procedure Note  Procedure: CT guided aspirate and core biopsy of right posterior iliac bone Complications: None Recommendations: - Bedrest supine x 1 hrs - OTC's PRN  Pain - Follow biopsy results  Signed,  Carmelite Violet S. Fartun Paradiso, DO    

## 2018-03-31 NOTE — Progress Notes (Signed)
PT transported to [place] via [place]. All belongings packed and patient sticker adhered. Report given to [facility] nurse and Estill Bamberg on 2W. No s/s of distress. PT to call daughter and mother in the morning with room information.

## 2018-03-31 NOTE — Progress Notes (Signed)
Pt to radiology for CT guided bone biopsy.

## 2018-03-31 NOTE — Sedation Documentation (Signed)
Patient denies pain and is resting comfortably.  

## 2018-03-31 NOTE — Progress Notes (Signed)
Patient declined NIV at this time, no distress noted.  

## 2018-03-31 NOTE — Consult Note (Signed)
Chief Complaint: Patient was seen in consultation today for Bone marrow biopsy Chief Complaint  Patient presents with  . Respiratory Distress   at the request of Dr Wardell Heath   Supervising Physician: Marvin Davis  Patient Status: [facility] - In-pt  History of Present Illness: Marvin Davis is a 62 y.o. male   Pancytopenia- unexplained COPD with exacerbation; hypoxia CAP Dr Delton Coombes requesting Bone marrow bx    Past Medical History:  Diagnosis Date  . Anemia   . COPD (chronic obstructive pulmonary disease) ([place])   . On home O2    2L N/C  . Peripheral neuropathy   . Thrombocytopenia ([place])     History reviewed. No pertinent surgical history.  Allergies: Patient has no known allergies.  Medications: Prior to Admission medications   Medication Sig Start Date End Date Taking? Authorizing Provider  albuterol (PROVENTIL HFA;VENTOLIN HFA) 108 (90 Base) MCG/ACT inhaler INHALE 2 PUFFS INTO THE LUNGS EVERY 6 HOURS AS NEEDED FOR WHEEZING OR BREATHING Patient taking differently: Inhale 2 puffs into the lungs every 4 (four) hours as needed. INHALE 2 PUFFS INTO THE LUNGS EVERY 6 HOURS AS NEEDED FOR WHEEZING OR BREATHING 02/04/18  Yes Soyla Dryer, PA-C  albuterol (PROVENTIL) (2.5 MG/3ML) 0.083% nebulizer solution INHALE 1 VIAL VIA NEBULIZER EVERY 4 HOURS AS NEEDED FOR WHEEZING OR SHORTNESS OF BREATH Patient taking differently: Take 2.5 mg by nebulization every 4 (four) hours as needed for wheezing or shortness of breath. INHALE 1 VIAL VIA NEBULIZER EVERY 4 HOURS AS NEEDED FOR WHEEZING OR SHORTNESS OF BREATH 02/04/18  Yes Soyla Dryer, PA-C  amLODipine (NORVASC) 10 MG tablet TAKE 1 TABLET BY MOUTH ONCE DAILY 02/02/18  Yes Soyla Dryer, PA-C  IRON PO Take 1 tablet by mouth daily.    Yes [provider]  mometasone-formoterol (DULERA) 200-5 MCG/ACT AERO INHALE 2 PUFFS BY MOUTH TWICE DAILY. RINSE MOUTH AFTER EACH USE Patient taking differently: Inhale 2 puffs  into the lungs 4 (four) times daily. INHALE 2 PUFFS BY MOUTH TWICE DAILY. RINSE MOUTH AFTER EACH USE 02/11/18  Yes Soyla Dryer, PA-C     Family History  Problem Relation Age of Onset  . Hypertension Father   . Stroke Father   . Diabetes Brother     Social History   Socioeconomic History  . Marital status: Divorced    Spouse name: Not on file  . Number of children: Not on file  . Years of education: Not on file  . Highest education level: Not on file  Occupational History  . Not on file  Social Needs  . Financial resource strain: Not on file  . Food insecurity:    Worry: Not on file    Inability: Not on file  . Transportation needs:    Medical: Not on file    Non-medical: Not on file  Tobacco Use  . Smoking status: Former Smoker    Packs/day: 1.50    Years: 46.00    Pack years: 69.00    Types: Cigarettes    Last attempt to quit: 06/26/2016    Years since quitting: 1.7  . Smokeless tobacco: Never Used  Substance and Sexual Activity  . Alcohol use: Yes    Comment: every week   . Drug use: No    Types: Marijuana, Cocaine    Comment: Last marijuana use 2015. Last cocaine use 2016  . Sexual activity: Never  Lifestyle  . Physical activity:    Days per week: Not on file  Minutes per session: Not on file  . Stress: Not on file  Relationships  . Social connections:    Talks on phone: Not on file    Gets together: Not on file    Attends religious service: Not on file    Active member of club or organization: Not on file    Attends meetings of clubs or organizations: Not on file    Relationship status: Not on file  Other Topics Concern  . Not on file  Social History Narrative  . Not on file    Review of Systems: A 12 point ROS discussed and pertinent positives are indicated in the HPI above.  All other systems are negative.  Review of Systems  Constitutional: Positive for activity change, appetite change and fatigue. Negative for fever.  Respiratory:  Positive for cough, shortness of breath and wheezing.   Cardiovascular: Negative for chest pain.  Gastrointestinal: Negative for abdominal pain.  Musculoskeletal: Negative for back pain.  Neurological: Positive for weakness.  Psychiatric/Behavioral: Negative for behavioral problems and confusion.    Vital Signs: BP 139/79 (BP Location: Right Arm)   Pulse 79   Temp 97.7 F (36.5 C) (Oral)   Resp 18   Ht 5' 5"  (1.651 m)   Wt 190 lb 4.1 oz (86.3 kg)   SpO2 96%   BMI 31.66 kg/m   Physical Exam  Constitutional: He is oriented to person, place, and time.  Cardiovascular: Normal rate, regular rhythm and normal heart sounds.  Pulmonary/Chest: Effort normal. He has wheezes.  Abdominal: Soft. Bowel sounds are normal.  Musculoskeletal: Normal range of motion.  Neurological: He is alert and oriented to person, place, and time.  Skin: Skin is warm and dry.  Psychiatric: He has a normal mood and affect. His behavior is normal. Judgment and thought content normal.  Vitals reviewed.   Imaging: Mr Abdomen W Wo Contrast  Result Date: 03/30/2018 CLINICAL DATA:  62 year old male with history of left-sided renal mass identified by recent ultrasound examination. Follow-up study for characterization. EXAM: MRI ABDOMEN WITHOUT AND WITH CONTRAST TECHNIQUE: Multiplanar multisequence MR imaging of the abdomen was performed both before and after the administration of intravenous contrast. CONTRAST:  8 mL of Gadavist. COMPARISON:  No prior abdominal MRI. Abdominal ultrasound 03/24/2018. FINDINGS: Lower chest: Small right pleural effusion lying dependently. Trace left pleural effusion lying dependently. Hepatobiliary: No suspicious cystic or solid hepatic lesions. No intra or extrahepatic biliary ductal dilatation. Gallbladder is normal in appearance. Pancreas: No pancreatic mass. No pancreatic ductal dilatation. No pancreatic or peripancreatic fluid or inflammatory changes. Spleen:  Unremarkable.  Adrenals/Urinary Tract: In the left kidney involving predominantly the anterior aspect of the lower pole there is a large mass measuring 8.5 x 7.4 x 9.2 cm (axial images 56 and 62 of series 21 and coronal image 56 of series 23) which demonstrates very heterogeneous internal signal characteristics with areas of both T1 and T2 hyperintensity, with several other areas that are far more heterogeneous in signal characteristics. On T2 weighted images along the posterior margin of the lesion there is some T2 hypointense material (axial image 23 of series 5) which appears to correspond to some enhancement on post gadolinium subtraction images (axial image 50 of series 7). Additional areas of internal low level enhancement may also be present, although this is difficult to say for certain, as this subtle perceived enhancement could simply reflect misregistration artifact on subtraction imaging. At this time, the lesion appears separate from the left renal vein. This lesion  appears to be encapsulated within Gerota's fascia. In the medial aspect of the lower pole of the right kidney there is a 1.8 cm T1 hypointense, T2 hyperintense, nonenhancing lesion, compatible with a small simple cyst. Bilateral adrenal glands are normal in appearance. No hydroureteronephrosis in the visualized portions of the abdomen. Stomach/Bowel: Visualized portions are unremarkable. Vascular/Lymphatic: No aneurysm identified in the visualized abdominal vasculature no definite neovascularity associated with the left renal lesion. No lymphadenopathy identified in the abdomen. Other: No significant volume of ascites noted in the visualized portions of the peritoneal cavity. Musculoskeletal: No aggressive appearing osseous lesions are noted in the visualized portions of the skeleton. IMPRESSION: 1. Complex left renal mass measuring approximately 8.5 x 7.4 x 9.2 cm with evidence of internal hemorrhage and small amount of perceivable mural enhancement along  the posterior margin of the lesion with additional areas of potential low-level progressive enhancement internally. 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 At this time, the lesion appears encapsulated within Gerota's fascia, does not involve the left renal vein, and there is no associated lymphadenopathy or definite signs of metastatic disease in the abdomen. Urologic consultation is recommended. 2. Small right pleural effusion lying dependently. If there is any clinical concern for metastatic disease to the chest, further evaluation with nonemergent chest CT should be considered in the near future. These results will be called to the ordering clinician or representative by the Radiologist Assistant, and communication documented in the PACS or zVision Dashboard. Electronically Signed   By: Vinnie Langton M.D.   On: 03/30/2018 08:45   US Abdomen Complete  Result Date: 03/24/2018 CLINICAL DATA:  Thrombocytopenia, anemia, acute kidney injury. EXAM: ABDOMEN ULTRASOUND COMPLETE COMPARISON:  None. FINDINGS: Gallbladder: No gallstones or wall thickening visualized. No sonographic Murphy sign noted by sonographer. Common bile duct: Diameter: 3 mm Liver: No focal lesion identified. Within normal limits in parenchymal echogenicity. Portal vein is patent on color Doppler imaging with normal direction of blood flow towards the liver. IVC: No abnormality visualized. Pancreas: Bowel gas limits evaluation of the pancreatic head and tail. The pancreatic body is grossly normal. Spleen: Size and appearance within normal limits. Right Kidney: Length: 10.1 cm. The renal cortical echotexture remains lower than that of the liver. There is no hydronephrosis. An echogenic focus in the upper pole of the right kidney measures 9 mm but exhibits no distal shadowing. This may reflect an angiomyolipoma. Left Kidney: Length: 11.1 cm. The renal cortical echotexture is similar to that on the right. Exophytic from  the midpole there is an abnormal complex appearing mass measuring 10.5 x 7 x 6.6 cm. There is no hydronephrosis. Abdominal aorta: No aneurysm visualized. Other findings: There is a right pleural effusion. IMPRESSION: Abnormal appearance of the left kidney with a complex mass arising from the midpole suspicious for malignancy. Renal protocol MRI or triphasic renal CT scanning is recommended. 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 Normal appearance of the hepatobiliary tree with the exception of limited visualization of the pancreas. Electronically Signed   By: David  Martinique M.D.   On: 03/24/2018 13:26   Dg Chest Port 1 View  Result Date: 03/23/2018 CLINICAL DATA:  Increased shortness of breath since last night. EXAM: PORTABLE CHEST 1 VIEW COMPARISON:  One-view chest x-ray 06/21/2016 FINDINGS: The heart size is normal. The aortic arch is within normal limits. New right middle lobe airspace disease is consistent with pneumonia. Minimal left basilar atelectasis is present. IMPRESSION: 1.  Right middle lobe pneumonia. 2. Minimal left basilar atelectasis. Electronically Signed   By: San Morelle M.D.   On: 03/23/2018 10:08    Labs:  CBC: Recent Labs    03/26/18 1351 03/27/18 0403 03/28/18 1048 03/30/18 0504  WBC 0.4* 0.3* 0.4* 0.4*  HGB 8.4* 7.5* 7.6* 7.3*  HCT 26.7* 23.5* 24.5* 23.5*  PLT 60* 42* 39* 29*    COAGS: Recent Labs    03/26/18 1351  INR 1.36    BMP: Recent Labs    03/24/18 0220 03/25/18 0933 03/26/18 1351 03/27/18 0403 03/30/18 0504  NA 137 140 143 141  --   K 5.6* 4.8 4.4 4.8  --   CL 103 105 108 108  --   CO2 28 26 27 29   --   GLUCOSE 169* 200* 160* 168*  --   BUN 22 23 22 20   --   CALCIUM 8.6* 8.7* 8.7* 8.5*  --   CREATININE 0.94 0.81 0.75 0.65 0.55*  GFRNONAA >60 >60 >60 >60 >60  GFRAA >60 >60 >60 >60 >60    LIVER FUNCTION TESTS: Recent Labs    03/25/18 0933  BILITOT 0.7  AST 23  ALT 30  ALKPHOS 53  PROT  7.2  ALBUMIN 3.0*    TUMOR MARKERS: No results for input(s): AFPTM, CEA, CA199, CHROMGRNA in the last 8760 hours.  Assessment and Plan:  COPD exacerbation; CAP Hypoxia Pancytopenia - unexplained Scheduled for Bone marrow bx Risks and benefits discussed with the patient including, but not limited to bleeding, infection, damage to adjacent structures or low yield requiring additional tests.  All of the patient's questions were answered, patient is agreeable to proceed. Consent signed and in chart.   Thank you for this interesting consult.  I greatly enjoyed meeting ELISA SORLIE and look forward to participating in their care.  A copy of this report was sent to the requesting provider on this date.  Electronically Signed: Lavonia Drafts, PA-C 03/31/2018, 8:15 AM   I spent a total of 20 Minutes    in face to face in clinical consultation, greater than 50% of which was counseling/coordinating care for BM bx

## 2018-04-01 ENCOUNTER — Ambulatory Visit: Payer: Self-pay | Admitting: Physician Assistant

## 2018-04-01 ENCOUNTER — Inpatient Hospital Stay (HOSPITAL_COMMUNITY): Payer: Medicaid Other

## 2018-04-01 ENCOUNTER — Other Ambulatory Visit (HOSPITAL_COMMUNITY): Payer: Self-pay | Admitting: Nurse Practitioner

## 2018-04-01 DIAGNOSIS — J441 Chronic obstructive pulmonary disease with (acute) exacerbation: Secondary | ICD-10-CM

## 2018-04-01 LAB — BASIC METABOLIC PANEL
Anion gap: 4 — ABNORMAL LOW (ref 5–15)
BUN: 23 mg/dL (ref 8–23)
CHLORIDE: 97 mmol/L — AB (ref 98–111)
CO2: 43 mmol/L — AB (ref 22–32)
Calcium: 9 mg/dL (ref 8.9–10.3)
Creatinine, Ser: 0.79 mg/dL (ref 0.61–1.24)
GFR calc Af Amer: >60 mL/min (ref >60)
GFR calc non Af Amer: >60 mL/min (ref >60)
Glucose, Bld: 126 mg/dL — ABNORMAL HIGH (ref 70–99)
POTASSIUM: 4.9 mmol/L (ref 3.5–5.1)
SODIUM: 144 mmol/L (ref 135–145)

## 2018-04-01 LAB — CBC WITH DIFFERENTIAL/PLATELET
ABS IMMATURE GRANULOCYTES: 0.02 10*3/uL (ref 0.00–0.07)
Basophils Absolute: 0 10*3/uL (ref 0.0–0.1)
Basophils Relative: 0 %
Eosinophils Absolute: 0 10*3/uL (ref 0.0–0.5)
Eosinophils Relative: 0 %
HEMATOCRIT: 24.8 % — AB (ref 39.0–52.0)
HEMOGLOBIN: 7.8 g/dL — AB (ref 13.0–17.0)
Immature Granulocytes: 3 %
LYMPHS ABS: 0.3 10*3/uL — AB (ref 0.7–4.0)
LYMPHS PCT: 32 %
MCH: 28.4 pg (ref 26.0–34.0)
MCHC: 31.5 g/dL (ref 30.0–36.0)
MCV: 90.2 fL (ref 80.0–100.0)
MONO ABS: 0 10*3/uL — AB (ref 0.1–1.0)
MONOS PCT: 4 %
NEUTROS ABS: 0.5 10*3/uL — AB (ref 1.7–7.7)
Neutrophils Relative %: 61 %
Platelets: 24 10*3/uL — CL (ref 150–400)
RBC: 2.75 MIL/uL — AB (ref 4.22–5.81)
RDW: 17.3 % — ABNORMAL HIGH (ref 11.5–15.5)
WBC: 0.8 10*3/uL — CL (ref 4.0–10.5)
nRBC: 0 % (ref 0.0–0.2)

## 2018-04-01 LAB — PATHOLOGIST SMEAR REVIEW: Path Review: INCREASED

## 2018-04-01 LAB — BRAIN NATRIURETIC PEPTIDE: B NATRIURETIC PEPTIDE 5: 29.2 pg/mL (ref 0.0–100.0)

## 2018-04-01 MED ORDER — LEVALBUTEROL HCL 0.63 MG/3ML IN NEBU
0.6300 mg | INHALATION_SOLUTION | Freq: Four times a day (QID) | RESPIRATORY_TRACT | Status: DC
  Administered 2018-04-01: 0.63 mg via RESPIRATORY_TRACT
  Filled 2018-04-01: qty 3

## 2018-04-01 MED ORDER — LEVALBUTEROL HCL 1.25 MG/0.5ML IN NEBU
1.2500 mg | INHALATION_SOLUTION | Freq: Four times a day (QID) | RESPIRATORY_TRACT | Status: DC
  Administered 2018-04-01 – 2018-04-02 (×5): 1.25 mg via RESPIRATORY_TRACT
  Filled 2018-04-01 (×5): qty 0.5

## 2018-04-01 MED ORDER — METHYLPREDNISOLONE SODIUM SUCC 125 MG IJ SOLR
60.0000 mg | Freq: Two times a day (BID) | INTRAMUSCULAR | Status: DC
  Administered 2018-04-01 – 2018-04-02 (×3): 60 mg via INTRAVENOUS
  Filled 2018-04-01 (×3): qty 2

## 2018-04-01 MED ORDER — SODIUM CHLORIDE 0.9 % IV SOLN
2.0000 g | Freq: Three times a day (TID) | INTRAVENOUS | Status: DC
  Administered 2018-04-01 – 2018-04-02 (×4): 2 g via INTRAVENOUS
  Filled 2018-04-01 (×6): qty 2

## 2018-04-01 MED ORDER — IPRATROPIUM-ALBUTEROL 0.5-2.5 (3) MG/3ML IN SOLN: 3.0000 mL | RESPIRATORY_TRACT | Status: DC

## 2018-04-01 MED ORDER — IOPAMIDOL (ISOVUE-370) INJECTION 76%
100.0000 mL | Freq: Once | INTRAVENOUS | Status: AC | PRN
  Administered 2018-04-01: 75 mL via INTRAVENOUS

## 2018-04-01 MED ORDER — IPRATROPIUM BROMIDE 0.02 % IN SOLN
0.5000 mg | Freq: Four times a day (QID) | RESPIRATORY_TRACT | Status: DC
  Administered 2018-04-01 – 2018-04-02 (×6): 0.5 mg via RESPIRATORY_TRACT
  Filled 2018-04-01 (×6): qty 2.5

## 2018-04-01 MED ORDER — IOPAMIDOL (ISOVUE-370) INJECTION 76%
INTRAVENOUS | Status: AC
  Filled 2018-04-01: qty 100

## 2018-04-01 NOTE — Progress Notes (Signed)
Nutrition Follow Up  DOCUMENTATION CODES:   Not applicable  INTERVENTION:    Ensure Enlive po BID, each supplement provides 350 kcal and 20 grams of protein  NUTRITION DIAGNOSIS:   Increased nutrient needs related to chronic illness(COPD chronic O2 at home- 2 liters) as evidenced by estimated needs, per patient/family report, ongoing  GOAL:   Patient will meet greater than or equal to 90% of their needs, met  MONITOR:   PO intake, Supplement acceptance, Weight trends, Labs  ASSESSMENT: Patient has hx of  COPD on 2 Liters O2 at home. Requiring BiPAP support currently. Short of breath when conversing.   Pt continues on a Heart Healthy diet. PO intake 100% per flowsheet records. Receiving Ensure Enlive supplements. Labs & medications reviewed.  Diet Order:   Diet Order            Diet Heart Room service appropriate? Yes; Fluid consistency: Thin  Diet effective now             EDUCATION NEEDS:   No education needs have been identified at this time   Skin:  Skin Assessment: Reviewed RN Assessment  Last BM:  11/5  Height:   Ht Readings from Last 1 Encounters:  03/31/18 5' 5"  (1.651 m)   Weight:   Wt Readings from Last 1 Encounters:  03/31/18 86.3 kg   Ideal Body Weight:  62 kg  BMI:  Body mass index is 31.66 kg/m.  Estimated Nutritional Needs:   Kcal:  2200-2400  Protein:  100-110 gm  Fluid:  2.2-2.4 L  Arthur Holms, RD, LDN Pager #: [phone] After-Hours Pager #: [phone]

## 2018-04-01 NOTE — Progress Notes (Signed)
Physical Therapy Treatment Patient Details Name: Marvin Davis MRN: [id number] DOB: [birth year] Today's Date: 04/01/2018    History of Present Illness Marvin Davis is a 62 y.o. male with medical history significant of COPD.  History is somewhat limited since he is currently on BiPAP and no family is present to offer further history.  Per medical record, patient has been short of breath for the past month.  He had increased wheezing that has not improved with his nebulizer treatments.  Over the past few days he had a worsening cough.  He describes a tightness across his chest.  No reported fever, nausea, vomiting or diarrhea    PT Comments    Patient progressing with therapy, ambulating further distances on 3L supplemental O2 without desatting. DOE 3/4 and fatigued after 35' of walking. Agree with HHPT recs.     Follow Up Recommendations  Home health PT;Supervision - Intermittent     Equipment Recommendations  None recommended by PT    Recommendations for Other Services       Precautions / Restrictions Precautions Precaution Comments: monitor O2 saturations, Home O2 dependent Restrictions Weight Bearing Restrictions: No    Mobility  Bed Mobility Overal bed mobility: Modified Independent             General bed mobility comments: head of bed raised  Transfers Overall transfer level: Modified independent Equipment used: None Transfers: Sit to/from [facility] Sit to Stand: Modified independent (Device/Increase time) Stand pivot transfers: Modified independent (Device/Increase time)          Ambulation/Gait Ambulation/Gait assistance: Supervision Gait Distance (Feet): 40 Feet Assistive device: None Gait Pattern/deviations: Decreased step length - right;Decreased step length - left;Decreased stride length Gait velocity: decreased   General Gait Details: ambulating without AD, supervision, DOE 3/4 SpO2 WNL on 3L   Stairs              Wheelchair Mobility    Modified Rankin (Stroke Patients Only)       Balance Overall balance assessment: No apparent balance deficits (not formally assessed)                                          Cognition Arousal/Alertness: Awake/alert Behavior During Therapy: WFL for tasks assessed/performed Overall Cognitive Status: Within Functional Limits for tasks assessed                                        Exercises      General Comments        Pertinent Vitals/Pain Pain Assessment: No/denies pain Pain Score: 0-No pain    Home Living                      Prior Function            PT Goals (current goals can now be found in the care plan section) Acute Rehab PT Goals Patient Stated Goal: return home with family to assist PT Goal Formulation: With patient Time For Goal Achievement: 04/14/18 Potential to Achieve Goals: Good Progress towards PT goals: Progressing toward goals    Frequency    Min 3X/week      PT Plan Current plan remains appropriate    Co-evaluation  AM-PAC PT "6 Clicks" Daily Activity  Outcome Measure  Difficulty turning over in bed (including adjusting bedclothes, sheets and blankets)?: None Difficulty moving from lying on back to sitting on the side of the bed? : None Difficulty sitting down on and standing up from a chair with arms (e.g., wheelchair, bedside commode, etc,.)?: A Little Help needed moving to and from a bed to chair (including a wheelchair)?: A Little Help needed walking in hospital room?: A Little Help needed climbing 3-5 steps with a railing? : A Little 6 Click Score: 20    End of Session Equipment Utilized During Treatment: Oxygen;Gait belt Activity Tolerance: Patient tolerated treatment well;Patient limited by fatigue Patient left: in chair;with call bell/phone within reach Nurse Communication: Mobility status PT Visit Diagnosis: Unsteadiness on  feet (R26.81);Other abnormalities of gait and mobility (R26.89);Muscle weakness (generalized) (M62.81)     Time: 3785-8850 PT Time Calculation (min) (ACUTE ONLY): 19 min  Charges:  $Gait Training: 8-22 mins                      Reinaldo Berber, PT, DPT Acute Rehabilitation Services Pager: [phone] Office: [phone]      Reinaldo Berber 04/01/2018, 2:06 PM

## 2018-04-01 NOTE — Progress Notes (Signed)
Pharmacy Antibiotic Note  Marvin Davis is a 62 y.o. male admitted on 03/23/2018 with febrile neutropenia. Patient's ANC is 500 and Tmax 102.5. Pharmacy has been consulted for cefepime dosing.  Plan: Cefepime 2gm IV q8h Monitor for clinical course, ANC, and renal function  Height: 5\' 5"  (165.1 cm) Weight: 190 lb 4.1 oz (86.3 kg) IBW/kg (Calculated) : 61.5  Temp (24hrs), Avg:99.8 F (37.7 C), Min:98.4 F (36.9 C), Max:102.5 F (39.2 C)  Recent Labs  Lab 03/26/18 1351 03/27/18 0403 03/28/18 1048 03/30/18 0504 03/31/18 1105 04/01/18 0231  WBC 0.4* 0.3* 0.4* 0.4* 0.5* 0.8*  CREATININE 0.75 0.65  --  0.55*  --  0.79    Estimated Creatinine Clearance: 95.4 mL/min (by C-G formula based on SCr of 0.79 mg/dL).    No Known Allergies  Antimicrobials this admission: 10/28 Ceftriaxone >> 10/31 10/28 Azithromycin >> 10/31 11/1 Augmentin>>11/5 11/6 Cefepime >>   Marvin Davis A. Levada Dy, PharmD, [place] Pager: [phone] Please utilize Amion for appropriate phone number to reach the unit pharmacist ([place])   04/01/2018 10:03 AM

## 2018-04-01 NOTE — Progress Notes (Signed)
PROGRESS NOTE  Marvin Davis ZRA:[id number] DOB: [birth year] DOA: 03/23/2018 PCP: Soyla Dryer, PA-C   LOS: 9 days   Brief Narrative / Interim history: 62 year old male who does not follow up with MDs regularly, history of COPD with history of tobacco abuse quit 3 years ago who was admitted to any pain hospital on 03/23/2018 with progressive shortness of breath.  He was diagnosed with COPD exacerbation and pneumonia.  On admission he was on BiPAP, now weaned off to nasal cannula.  In addition, he was diagnosed with pancytopenia over several weeks, and hematology was consulted.  Recommended bone marrow biopsy for which she was transferred to [facility] from [facility], underwent IR guided bone marrow biopsy on 11/5.  Subjective: More short of breath today.  No nausea no vomiting no fever no chills.  No chest pain.  Assessment & Plan: Active Problems:   Acute respiratory failure with hypoxia (HCC)   COPD  GOLD III    Hypertension   Anemia   Thrombocytopenia (HCC)   CAP (community acquired pneumonia)   AKI (acute kidney injury) ([place])   Acute on chronic respiratory failure with hypoxia in the setting of COPD exacerbation and pneumonia -Severe initially requiring BiPAP, now weaned off to 3 L nasal cannula.  He has chronic oxygen at home on 2 L -Continue Pulmicort, continue duo nebs, will transition Solu-Medrol to prednisone -Still with significant wheezing, will get CT chest PE protocol to rule out PE as well as further work-up for pulmonary mass.  Community-acquired pneumonia -She was initially on ceftriaxone and azithromycin, has been transitioned to Augmentin and has completed a 9-day course.  COPD exacerbation -Improving however still wheezing and shortness of breath.  Continue nebulizers and steroids.  Continue flutter valve.  Acute kidney injury -improved with hydration  Pancytopenia -Hematology was consulted, unclear etiology.  B12 is in the low range of  normal however MMA is normal.  Iron and ferritin are elevated.  He does not have any cirrhosis or splenomegaly on abdominal ultrasound.  No history of alcohol.  He did require 2 units of packed red blood cells with improvement in hemoglobin and has remained stable since.  He has no evidence of a GI bleed with negative Hemoccult.  His platelets have been trending down but seems to have stabilized in the last couple of days.  He remains leukopenic.  He is status post bone marrow biopsy on 11/5, he would be able to be discharged home when respiratory status improves and will follow up with hematology in [place]  Discussed with oncology here, recommend waiting for biopsy results.  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  Dr. Dyann Kief had discussed with urology and recommended outpatient follow-up   Scheduled Meds: . amLODipine  10 mg Oral Daily  . arformoterol  15 mcg Nebulization BID  . budesonide (PULMICORT) nebulizer solution  0.5 mg Nebulization BID  . chlorhexidine  15 mL Mouth Rinse BID  . feeding supplement (ENSURE ENLIVE)  237 mL Oral BID BM  . guaiFENesin  1,200 mg Oral BID  . iopamidol      . ipratropium  0.5 mg Nebulization Q6H  . levalbuterol  1.25 mg Nebulization Q6H WA  . mouth rinse  15 mL Mouth Rinse q12n4p  . methylPREDNISolone (SOLU-MEDROL) injection  60 mg Intravenous Q12H  . sodium chloride flush  3 mL Intravenous Q12H  Continuous Infusions: . ceFEPime (MAXIPIME) IV 2 g (04/01/18 1357)   PRN Meds:.acetaminophen, albuterol, HYDROcodone-acetaminophen, zolpidem  DVT prophylaxis: SCDs Code Status: Full code Family Communication: No family present at bedside Disposition Plan: Discharge home 1 to 2 days based on respiratory status  Consultants:   Hematology  IR  Urology (Dr. Tresa Moore)  Procedures:   Bone marrow biopsy  11/5  Antimicrobials:  Ceftriaxone 10/28 >> 11/1  Azithromycin 10/28 >> 11/1  Augmentin 11/1 >> 11/5  Objective: Vitals:   04/01/18 0853 04/01/18 1429 04/01/18 1712 04/01/18 2045  BP:   117/74   Pulse:   86   Resp:   13   Temp: (!) 102.5 F (39.2 C)  97.8 F (36.6 C)   TempSrc: Oral  Oral   SpO2:  98% 98% 96%  Weight:      Height:        Intake/Output Summary (Last 24 hours) at 04/01/2018 2046 Last data filed at 04/01/2018 1345 Gross per 24 hour  Intake 483 ml  Output 1875 ml  Net -1392 ml   Filed Weights   03/25/18 0500 03/26/18 0447 03/31/18 0100  Weight: 84.2 kg 86 kg 86.3 kg    Examination:  Constitutional: Slightly tachypneic Eyes: PERRL, lids and conjunctivae normal ENMT: Mucous membranes are moist.  Neck: normal, supple Respiratory: Diffuse bilateral wheezing, no crackles heard.  Increased expiratory effort Cardiovascular: Regular rate and rhythm, no murmurs / rubs / gallops. No LE edema. 2+ pedal pulses. No carotid bruits.  Abdomen: no tenderness. Bowel sounds positive.  Skin: no rashes seen Neurologic: Nonfocal Psychiatric: Normal judgment and insight. Alert and oriented x 3. Normal mood.    Data Reviewed: I have independently reviewed following labs and imaging studies   CBC: Recent Labs  Lab 03/26/18 1351 03/27/18 0403 03/28/18 1048 03/30/18 0504 03/31/18 1105 04/01/18 0231  WBC 0.4* 0.3* 0.4* 0.4* 0.5* 0.8*  NEUTROABS 0.3* 0.2*  --  0.3* 0.4* 0.5*  HGB 8.4* 7.5* 7.6* 7.3* 8.2* 7.8*  HCT 26.7* 23.5* 24.5* 23.5* 26.4* 24.8*  MCV 88.7 90.4 90.1 89.7 89.8 90.2  PLT 60* 42* 39* 29* 28* 24*   Basic Metabolic Panel: Recent Labs  Lab 03/26/18 1351 03/27/18 0403 03/30/18 0504 04/01/18 0231  NA 143 141  --  144  K 4.4 4.8  --  4.9  CL 108 108  --  97*  CO2 27 29  --  43*  GLUCOSE 160* 168*  --  126*  BUN 22 20  --  23  CREATININE 0.75 0.65 0.55* 0.79  CALCIUM 8.7* 8.5*  --  9.0   GFR: Estimated Creatinine Clearance: 95.4 mL/min (by  C-G formula based on SCr of 0.79 mg/dL). Liver Function Tests: No results for input(s): AST, ALT, ALKPHOS, BILITOT, PROT, ALBUMIN in the last 168 hours. No results for input(s): LIPASE, AMYLASE in the last 168 hours. No results for input(s): AMMONIA in the last 168 hours. Coagulation Profile: Recent Labs  Lab 03/26/18 1351 03/31/18 1105  INR 1.36 1.38   Cardiac Enzymes: No results for input(s): CKTOTAL, CKMB, CKMBINDEX, TROPONINI in the last 168 hours. BNP (last 3 results) No results for input(s): PROBNP in the last 8760 hours. HbA1C: No results for input(s): HGBA1C in the last 72 hours. CBG: No results for input(s): GLUCAP in the last 168 hours. Lipid Profile: No results for input(s): CHOL, HDL, LDLCALC, TRIG, CHOLHDL, LDLDIRECT in the last 72 hours. Thyroid Function Tests: No results for input(s): TSH, T4TOTAL, FREET4, T3FREE, THYROIDAB in the last  72 hours. Anemia Panel: No results for input(s): VITAMINB12, FOLATE, FERRITIN, TIBC, IRON, RETICCTPCT in the last 72 hours. Urine analysis:    Component Value Date/Time   BILIRUBINUR NEG 09/29/2017 0928   PROTEINUR TRACE 09/29/2017 0928   UROBILINOGEN 0.2 09/29/2017 0928   NITRITE NEG 09/29/2017 0928   LEUKOCYTESUR Negative 09/29/2017 0928   Sepsis Labs: Invalid input(s): PROCALCITONIN, LACTICIDVEN  Recent Results (from the past 240 hour(s))  Culture, blood (routine x 2) Call MD if unable to obtain prior to antibiotics being given     Status: None   Collection Time: 03/23/18 10:56 AM  Result Value Ref Range Status   Specimen Description BLOOD RIGHT ARM  Final   Special Requests   Final    BOTTLES DRAWN AEROBIC ONLY Blood Culture adequate volume   Culture   Final    NO GROWTH 5 DAYS Performed at [facility], [address] [place], [place] [address]    Report Status 03/28/2018 FINAL  Final  Culture, blood (routine x 2) Call MD if unable to obtain prior to antibiotics being given     Status: None   Collection Time:  03/23/18 11:10 AM  Result Value Ref Range Status   Specimen Description BLOOD RIGHT FOREARM  Final   Special Requests   Final    BOTTLES DRAWN AEROBIC AND ANAEROBIC Blood Culture adequate volume   Culture   Final    NO GROWTH 5 DAYS Performed at [facility], [address] [place], [place] [address]    Report Status 03/28/2018 FINAL  Final  MRSA PCR Screening     Status: None   Collection Time: 03/23/18  2:25 PM  Result Value Ref Range Status   MRSA by PCR NEGATIVE NEGATIVE Final    Comment:        The GeneXpert MRSA Assay (FDA approved for NASAL specimens only), is one component of a comprehensive MRSA colonization surveillance program. It is not intended to diagnose MRSA infection nor to guide or monitor treatment for MRSA infections. Performed at [facility], [address] [place], [place] [address]       Radiology Studies: Dg Chest [facility] 1 View  Result Date: 04/01/2018 CLINICAL DATA:  Shortness of breath. EXAM: PORTABLE CHEST 1 VIEW COMPARISON:  03/23/2018.  06/21/2016. FINDINGS: Mediastinum is normal. Persistent dense right perihilar infiltrate and or mass noted. Persistent right base infiltrate and small right pleural effusion. No pneumothorax. Heart size stable. No acute bony abnormality. IMPRESSION: Persistent dense right perihilar infiltrate and or mass lesion noted. Persistent right base infiltrate and right pleural effusion. Continued close follow-up exams suggested. If no clearing is noted, CT may be needed for further evaluation to evaluate for a mass lesion. Electronically Signed   By: Marcello Moores  Register   On: 04/01/2018 10:43   Ct Bone Marrow Biopsy & Aspiration  Result Date: 03/31/2018 INDICATION: 62 year old male with a history of pancytopenia EXAM: CT BONE MARROW BIOPSY AND ASPIRATION MEDICATIONS: None. ANESTHESIA/SEDATION: Moderate (conscious) sedation was employed during this procedure. A total of Versed 2.0 mg and Fentanyl 100 mcg was administered  intravenously. Moderate Sedation Time: 10 minutes. The patient's level of consciousness and vital signs were monitored continuously by radiology nursing throughout the procedure under my direct supervision. FLUOROSCOPY TIME:  CT COMPLICATIONS: None PROCEDURE: The procedure risks, benefits, and alternatives were explained to the patient. Questions regarding the procedure were encouraged and answered. The patient understands and consents to the procedure. Scout CT of the pelvis was performed for surgical planning purposes.  The posterior pelvis was prepped with Chlorhexidine in a sterile fashion, and a sterile drape was applied covering the operative field. A sterile gown and sterile gloves were used for the procedure. Local anesthesia was provided with 1% Lidocaine. Posterior iliac bone was targeted for biopsy. The skin and subcutaneous tissues were infiltrated with 1% lidocaine without epinephrine. A small stab incision was made with an 11 blade scalpel, and an 11 gauge Murphy needle was advanced with CT guidance to the posterior cortex. Manual forced was used to advance the needle through the posterior cortex and the stylet was removed. A bone marrow aspirate was retrieved and passed to a cytotechnologist in the room. The Murphy needle was then advanced without the stylet for a core biopsy. The core biopsy was retrieved and also passed to a cytotechnologist. Manual pressure was used for hemostasis and a sterile dressing was placed. No complications were encountered no significant blood loss was encountered. Patient tolerated the procedure well and remained hemodynamically stable throughout. IMPRESSION: Status post CT-guided bone marrow biopsy, with tissue specimen sent to pathology for complete histopathologic analysis Signed, Dulcy Fanny. Earleen Newport, DO [facility] Radiology Electronically Signed   By: Corrie Mckusick D.O.   On: 03/31/2018 10:02     Electronically  signed: Berle Mull, MD [facility] 04/01/2018 8:47 PM    If 7PM-7AM, please contact night-coverage [email or web address] Password [facility] 04/01/2018, 8:46 PM

## 2018-04-02 ENCOUNTER — Other Ambulatory Visit (HOSPITAL_COMMUNITY): Payer: Self-pay | Admitting: Nurse Practitioner

## 2018-04-02 LAB — COMPREHENSIVE METABOLIC PANEL
ALT: 67 U/L — AB (ref 0–44)
AST: 24 U/L (ref 15–41)
Albumin: 2.3 g/dL — ABNORMAL LOW (ref 3.5–5.0)
Alkaline Phosphatase: 47 U/L (ref 38–126)
Anion gap: 6 (ref 5–15)
BUN: 19 mg/dL (ref 8–23)
CHLORIDE: 101 mmol/L (ref 98–111)
CO2: 37 mmol/L — AB (ref 22–32)
CREATININE: 0.71 mg/dL (ref 0.61–1.24)
Calcium: 8.9 mg/dL (ref 8.9–10.3)
GFR calc Af Amer: >60 mL/min (ref >60)
GFR calc non Af Amer: >60 mL/min (ref >60)
Glucose, Bld: 209 mg/dL — ABNORMAL HIGH (ref 70–99)
POTASSIUM: 4.8 mmol/L (ref 3.5–5.1)
SODIUM: 144 mmol/L (ref 135–145)
Total Bilirubin: 0.5 mg/dL (ref 0.3–1.2)
Total Protein: 5.8 g/dL — ABNORMAL LOW (ref 6.5–8.1)

## 2018-04-02 LAB — CBC WITH DIFFERENTIAL/PLATELET
ABS IMMATURE GRANULOCYTES: 0.02 10*3/uL (ref 0.00–0.07)
BASOS ABS: 0 10*3/uL (ref 0.0–0.1)
Basophils Relative: 0 %
EOS PCT: 0 %
Eosinophils Absolute: 0 10*3/uL (ref 0.0–0.5)
HEMATOCRIT: 22.5 % — AB (ref 39.0–52.0)
Hemoglobin: 7 g/dL — ABNORMAL LOW (ref 13.0–17.0)
Immature Granulocytes: 4 %
LYMPHS ABS: 0.1 10*3/uL — AB (ref 0.7–4.0)
LYMPHS PCT: 19 %
MCH: 28.3 pg (ref 26.0–34.0)
MCHC: 31.1 g/dL (ref 30.0–36.0)
MCV: 91.1 fL (ref 80.0–100.0)
MONO ABS: 0 10*3/uL — AB (ref 0.1–1.0)
Monocytes Relative: 2 %
NEUTROS ABS: 0.4 10*3/uL — AB (ref 1.7–7.7)
NRBC: 0 % (ref 0.0–0.2)
Neutrophils Relative %: 75 %
Platelets: 22 10*3/uL — CL (ref 150–400)
RBC: 2.47 MIL/uL — ABNORMAL LOW (ref 4.22–5.81)
RDW: 17.5 % — ABNORMAL HIGH (ref 11.5–15.5)
WBC: 0.5 10*3/uL — CL (ref 4.0–10.5)

## 2018-04-02 LAB — LACTIC ACID, PLASMA: LACTIC ACID, VENOUS: 2.3 mmol/L — AB (ref 0.5–1.9)

## 2018-04-02 LAB — MAGNESIUM: MAGNESIUM: 2.4 mg/dL (ref 1.7–2.4)

## 2018-04-02 MED ORDER — GUAIFENESIN ER 600 MG PO TB12: 1200.0000 mg | ORAL_TABLET | Freq: Two times a day (BID) | ORAL | Status: AC

## 2018-04-02 MED ORDER — SODIUM CHLORIDE 0.9 % IV SOLN: 2.0000 g | Freq: Three times a day (TID) | INTRAVENOUS | Status: AC

## 2018-04-02 MED ORDER — MORPHINE SULFATE (PF) 2 MG/ML IV SOLN: 2.0000 mg | Freq: Once | INTRAVENOUS | Status: DC

## 2018-04-02 MED ORDER — ENSURE ENLIVE PO LIQD: 237.0000 mL | Freq: Two times a day (BID) | ORAL | 12 refills | Status: AC

## 2018-04-02 MED ORDER — BUDESONIDE 0.5 MG/2ML IN SUSP: 0.5000 mg | Freq: Two times a day (BID) | RESPIRATORY_TRACT | 12 refills | Status: AC

## 2018-04-02 MED ORDER — HYDROCODONE-ACETAMINOPHEN 5-325 MG PO TABS: 1.0000 | ORAL_TABLET | Freq: Four times a day (QID) | ORAL | 0 refills | Status: AC | PRN

## 2018-04-02 MED ORDER — METHYLPREDNISOLONE SODIUM SUCC 125 MG IJ SOLR: 60.0000 mg | Freq: Two times a day (BID) | INTRAMUSCULAR | 0 refills | Status: AC

## 2018-04-02 MED ORDER — ARFORMOTEROL TARTRATE 15 MCG/2ML IN NEBU: 15.0000 ug | INHALATION_SOLUTION | Freq: Two times a day (BID) | RESPIRATORY_TRACT | Status: AC

## 2018-04-02 MED ORDER — LEVALBUTEROL HCL 1.25 MG/0.5ML IN NEBU: 1.2500 mg | INHALATION_SOLUTION | Freq: Four times a day (QID) | RESPIRATORY_TRACT | 12 refills | Status: AC

## 2018-04-02 NOTE — Progress Notes (Signed)
Pt has a bed available at [facility]. Report given to [facility], [place] at 2141. RN was made aware that Radiology is unable to make copies on disc due to file being to large. Per Radiology it will be "power shared over to [facility]". [facility] here for transport. Telemetry removed. Belongings packed and returned to pt for transport. Pt A&Ox4. Pt denies having pain or any complaints at this time. Pt does not want [place] to call family with update. Pt stated "I will handle it".

## 2018-04-02 NOTE — Discharge Summary (Addendum)
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 COPD exacerbation with right lower lobe community acquired pneumonia Febrile neutropenia Symptomatic anemia needing blood transfusion  Admitted From: home-2) hospital, transferred to [facility] for bone marrow biopsy Disposition: Discharge to tertiary level care for acute myeloid leukemia treatment  Recommendations for Outpatient Follow-up:  1. Please follow-up with PCP after discharge.  Follow-up Information    Soyla Dryer, PA-C Follow up on 04/07/2018.   Specialty:  Physician Assistant Why:  call and schedule you a hospital follow up  Contact information: [place] [address] [phone]          Diet recommendation: cardiac diet  Activity: The patient is advised to gradually reintroduce usual activities.  Discharge Condition: good  Code Status: full code  History of present illness: As per the H and P dictated on admission, "Marvin Davis is a 62 y.o. male with medical history significant of COPD.  History is somewhat limited since he is currently on BiPAP and no family is present to offer further history.  Per medical record, patient has been short of breath for the past month.  He had increased wheezing that has not improved with his nebulizer treatments.  Over the past few days he had a worsening cough.  He describes a tightness across his chest.  No reported fever, nausea, vomiting or diarrhea  ED Course: He was noted to have increased wheezing and work of breathing.  Chest x-ray indicated pneumonia.  He was noted to have significant anemia with a hemoglobin of 6.  He was also leukopenic with a WBC count of 1 and thrombocytopenic  with a platelet count of the 60s.  He had mild elevation of creatinine to 1.4.  He was placed on BiPAP for increased work of breathing.  He received nebulizer treatments, steroids and antibiotics."  Hospital Course:  Summary of his active problems in the hospital is as following. Acute on chronic respiratory failure with hypoxia in the setting of COPD exacerbation and pneumonia -Severe initially requiring BiPAP, now weaned off to 6L nasal cannula.  He has chronic oxygen at home on 2 L -Continue Pulmicort, continue duo nebs, Solu-Medrol and initial blood cultures were negative for any bacteremia.  MRSA PCR negative on 03/23/2018. -The skin of the chest is negative for any new pneumonia and actually shows improvement in the right lower lobe infiltrate. -She was initially on ceftriaxone and azithromycin, has been transitioned to Augmentin and has completed a 9-day course. Continue flutter valve.  Febrile neutropenia. With neutropenia patient had fever on 03/31/2018.  CT scan negative for any new pneumonia.  Blood cultures also were negative.  Patient was started on IV cefepime.  For now we will continue the same until patient is transferred to the facility.  Acute kidney injury -Presented with serum creatinine of 1.41, and improved with hydration.  Fluids have been stopped.  Pancytopenia Acute myeloid leukemia -Hematology was consulted,  B12 is in the low range of normal however MMA is normal.   Iron and ferritin are elevated.   He does not have any cirrhosis or splenomegaly on abdominal ultrasound.   No history of alcohol.   He did require 2 units of packed red blood cells with improvement  in hemoglobin and has remained stable since.   He has no evidence of a GI bleed with negative Hemoccult.   His platelets have been trending down but seems to have stabilized in the last couple of days.  He remains leukopenic.   He is status post bone marrow biopsy on 11/5,  Biopsy is concerning for  acute myeloid leukemia with 60% blast cell on the bone marrow biopsy and 38% blast on flow cytometry evaluation.  Discussed with [facility], [place] as well as [facility].  Patient will be transferred to facilities whenever they have a bed available.  Recommend to continue supportive measures and currently hold off on any treatment.  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  Dr. Dyann Kief had discussed with urology and recommended outpatient follow-up  Right upper lobe mass. CT scan of the chest as well as chest x-ray were concerning for a right upper lobe mass. CT scan shows evidence of 5.8 x 6 x 6 cm right solid right upper lobe mass with intralobular septal thickening concerning for lymphangitic spread. Also has circumferential soft tissue narrowing of the right upper and right middle bronchi concerning for endobronchial invasion. Mild mediastinal right hilar lymphadenopathy concerning for metastatic disease.  All other chronic medical condition were stable during the hospitalization.  On the day of the discharge the patient's [place] , and no other acute medical condition were reported by patient. the patient was felt safe to be discharge at tertiary level care with plan for treatment for acute myeloid leukemia.  Consultants: oncology, IR Procedures: IR guided bone marrow biopsy  DISCHARGE MEDICATION: Allergies as of 04/02/2018   No Known Allergies     Medication List    STOP taking these medications   albuterol (2.5 MG/3ML) 0.083% nebulizer solution Commonly known as:  PROVENTIL   albuterol 108 (90 Base) MCG/ACT inhaler Commonly known as:  PROVENTIL HFA;VENTOLIN HFA     TAKE these medications   amLODipine 10 MG tablet Commonly known as:  NORVASC TAKE 1 TABLET BY MOUTH ONCE DAILY   arformoterol 15 MCG/2ML Nebu Commonly known  as:  BROVANA Take 2 mLs (15 mcg total) by nebulization 2 (two) times daily.   budesonide 0.5 MG/2ML nebulizer solution Commonly known as:  PULMICORT Take 2 mLs (0.5 mg total) by nebulization 2 (two) times daily.   ceFEPIme 2 g in sodium chloride 0.9 % 100 mL Inject 2 g into the vein every 8 (eight) hours.   feeding supplement (ENSURE ENLIVE) Liqd Take 237 mLs by mouth 2 (two) times daily between meals. Start taking on:  04/03/2018   guaiFENesin 600 MG 12 hr tablet Commonly known as:  MUCINEX Take 2 tablets (1,200 mg total) by mouth 2 (two) times daily.   HYDROcodone-acetaminophen 5-325 MG tablet Commonly known as:  NORCO/VICODIN Take 1-2 tablets by mouth every 6 (six) hours as needed for moderate pain or severe pain.   IRON PO Take 1 tablet by mouth daily.   levalbuterol 1.25 MG/0.5ML nebulizer solution Commonly known as:  XOPENEX Take 1.25 mg by nebulization every 6 (six) hours.   methylPREDNISolone sodium succinate 125 mg/2 mL injection Commonly known as:  SOLU-MEDROL Inject 0.96 mLs (60 mg total) into the vein every 12 (twelve) hours.   mometasone-formoterol 200-5 MCG/ACT Aero Commonly known as:  DULERA INHALE 2 PUFFS BY MOUTH TWICE DAILY. RINSE MOUTH AFTER  EACH USE What changed:    how much to take  how to take this  when to take this      No Known Allergies Discharge Instructions    Diet - low sodium heart healthy   Complete by:  As directed    Increase activity slowly   Complete by:  As directed      Discharge Exam: Filed Weights   03/25/18 0500 03/26/18 0447 03/31/18 0100  Weight: 84.2 kg 86 kg 86.3 kg   Vitals:   04/02/18 1441 04/02/18 1631  BP:  134/66  Pulse:  83  Resp:  12  Temp:  97.6 F (36.4 C)  SpO2: 98% 100%   General: Appear in mild distress, no Rash; Oral Mucosa moist. Cardiovascular: S1 and S2 Present, no Murmur, no JVD Respiratory: Bilateral Air entry present and Clear to Auscultation, no Crackles, no wheezes Abdomen: Bowel  Sound present, Soft and no tenderness Extremities: no Pedal edema, no calf tenderness Neurology: Grossly no focal neuro deficit.  The results of significant diagnostics from this hospitalization (including imaging, microbiology, ancillary and laboratory) are listed below for reference.    Significant Diagnostic Studies: Ct Angio Chest Pe W Or Wo Contrast  Result Date: 04/01/2018 CLINICAL DATA:  Shortness of breath.  History of lung mass and COPD. EXAM: CT ANGIOGRAPHY CHEST WITH CONTRAST TECHNIQUE: Multidetector CT imaging of the chest was performed using the standard protocol during bolus administration of intravenous contrast. Multiplanar CT image reconstructions and MIPs were obtained to evaluate the vascular anatomy. CONTRAST:  19m ISOVUE-370 IOPAMIDOL (ISOVUE-370) INJECTION 76% COMPARISON:  Chest radiograph April 01, 2018, MRI abdomen March 30, 2018 FINDINGS: CARDIOVASCULAR: Adequate contrast opacification of the pulmonary artery's. Main pulmonary artery is not enlarged. No pulmonary arterial filling defects to the level of the subsegmental branches. Heart size is normal, no right heart strain. No pericardial effusion. Thoracic aorta is normal course and caliber, unremarkable. MEDIASTINUM/NODES: 11 mm LEFT paratracheal lymph node. Mild RIGHT hilar lymphadenopathy. LUNGS/PLEURA: No pneumothorax. Circumferential soft tissue narrowing the RIGHT upper and RIGHT middle lobe bronchi. 5.8 x 6 x 6.8 cm solid RIGHT upper lobe, anterior segment lung mass with surrounding interlobular septal thickening and centrilobular nodular densities. No chest wall invasion. Small RIGHT pleural effusion. Mild centrilobular emphysema. UPPER ABDOMEN: Non-acute.  Partially imaged LEFT nephrolithiasis. MUSCULOSKELETAL: Non-acute. Review of the MIP images confirms the above findings. IMPRESSION: 1. No acute pulmonary embolism. 2. 5.8 x 6 x 6.8 cm RIGHT upper lobe mass consistent with neoplasm with surrounding presumed  lymphangitic spread of tumor. 3. Mild mediastinal RIGHT hilar lymphadenopathy concerning for metastatic disease. 4. Soft tissue narrowing the RIGHT middle and RIGHT upper lobe bronchi, potential endobronchial invasion. 5. Small RIGHT pleural effusion. Emphysema (ICD10-J43.9). Electronically Signed   By: CElon AlasM.D.   On: 04/01/2018 21:00   Mr Abdomen W Wo Contrast  Result Date: 03/30/2018 CLINICAL DATA:  62year old male with history of left-sided renal mass identified by recent ultrasound examination. Follow-up study for characterization. EXAM: MRI ABDOMEN WITHOUT AND WITH CONTRAST TECHNIQUE: Multiplanar multisequence MR imaging of the abdomen was performed both before and after the administration of intravenous contrast. CONTRAST:  8 mL of Gadavist. COMPARISON:  No prior abdominal MRI. Abdominal ultrasound 03/24/2018. FINDINGS: Lower chest: Small right pleural effusion lying dependently. Trace left pleural effusion lying dependently. Hepatobiliary: No suspicious cystic or solid hepatic lesions. No intra or extrahepatic biliary ductal dilatation. Gallbladder is normal in appearance. Pancreas: No pancreatic mass. No pancreatic ductal dilatation. No pancreatic or peripancreatic  fluid or inflammatory changes. Spleen:  Unremarkable. Adrenals/Urinary Tract: In the left kidney involving predominantly the anterior aspect of the lower pole there is a large mass measuring 8.5 x 7.4 x 9.2 cm (axial images 56 and 62 of series 21 and coronal image 56 of series 23) which demonstrates very heterogeneous internal signal characteristics with areas of both T1 and T2 hyperintensity, with several other areas that are far more heterogeneous in signal characteristics. On T2 weighted images along the posterior margin of the lesion there is some T2 hypointense material (axial image 23 of series 5) which appears to correspond to some enhancement on post gadolinium subtraction images (axial image 50 of series 7). Additional  areas of internal low level enhancement may also be present, although this is difficult to say for certain, as this subtle perceived enhancement could simply reflect misregistration artifact on subtraction imaging. At this time, the lesion appears separate from the left renal vein. This lesion appears to be encapsulated within Gerota's fascia. In the medial aspect of the lower pole of the right kidney there is a 1.8 cm T1 hypointense, T2 hyperintense, nonenhancing lesion, compatible with a small simple cyst. Bilateral adrenal glands are normal in appearance. No hydroureteronephrosis in the visualized portions of the abdomen. Stomach/Bowel: Visualized portions are unremarkable. Vascular/Lymphatic: No aneurysm identified in the visualized abdominal vasculature no definite neovascularity associated with the left renal lesion. No lymphadenopathy identified in the abdomen. Other: No significant volume of ascites noted in the visualized portions of the peritoneal cavity. Musculoskeletal: No aggressive appearing osseous lesions are noted in the visualized portions of the skeleton. IMPRESSION: 1. Complex left renal mass measuring approximately 8.5 x 7.4 x 9.2 cm with evidence of internal hemorrhage and small amount of perceivable mural enhancement along the posterior margin of the lesion with additional areas of potential low-level progressive enhancement internally. 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 At this time, the lesion appears encapsulated within Gerota's fascia, does not involve the left renal vein, and there is no associated lymphadenopathy or definite signs of metastatic disease in the abdomen. Urologic consultation is recommended. 2. Small right pleural effusion lying dependently. If there is any clinical concern for metastatic disease to the chest, further evaluation with nonemergent chest CT should be considered in the near future. These results will be called to the ordering  clinician or representative by the Radiologist Assistant, and communication documented in the PACS or zVision Dashboard. Electronically Signed   By: Vinnie Langton M.D.   On: 03/30/2018 08:45   US Abdomen Complete  Result Date: 03/24/2018 CLINICAL DATA:  Thrombocytopenia, anemia, acute kidney injury. EXAM: ABDOMEN ULTRASOUND COMPLETE COMPARISON:  None. FINDINGS: Gallbladder: No gallstones or wall thickening visualized. No sonographic Murphy sign noted by sonographer. Common bile duct: Diameter: 3 mm Liver: No focal lesion identified. Within normal limits in parenchymal echogenicity. Portal vein is patent on color Doppler imaging with normal direction of blood flow towards the liver. IVC: No abnormality visualized. Pancreas: Bowel gas limits evaluation of the pancreatic head and tail. The pancreatic body is grossly normal. Spleen: Size and appearance within normal limits. Right Kidney: Length: 10.1 cm. The renal cortical echotexture remains lower than that of the liver. There is no hydronephrosis. An echogenic focus in the upper pole of the right kidney measures 9 mm but exhibits no distal shadowing. This may reflect an angiomyolipoma. Left Kidney: Length: 11.1 cm. The renal cortical echotexture is similar to that on the right. Exophytic from the  midpole there is an abnormal complex appearing mass measuring 10.5 x 7 x 6.6 cm. There is no hydronephrosis. Abdominal aorta: No aneurysm visualized. Other findings: There is a right pleural effusion. IMPRESSION: Abnormal appearance of the left kidney with a complex mass arising from the midpole suspicious for malignancy. Renal protocol MRI or triphasic renal CT scanning is recommended. 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 Normal appearance of the hepatobiliary tree with the exception of limited visualization of the pancreas. Electronically Signed   By: David  Martinique M.D.   On: 03/24/2018 13:26   Dg Chest Port 1 View  Result  Date: 04/01/2018 CLINICAL DATA:  Shortness of breath. EXAM: PORTABLE CHEST 1 VIEW COMPARISON:  03/23/2018.  06/21/2016. FINDINGS: Mediastinum is normal. Persistent dense right perihilar infiltrate and or mass noted. Persistent right base infiltrate and small right pleural effusion. No pneumothorax. Heart size stable. No acute bony abnormality. IMPRESSION: Persistent dense right perihilar infiltrate and or mass lesion noted. Persistent right base infiltrate and right pleural effusion. Continued close follow-up exams suggested. If no clearing is noted, CT may be needed for further evaluation to evaluate for a mass lesion. Electronically Signed   By: Marcello Moores  Register   On: 04/01/2018 10:43   Dg Chest Port 1 View  Result Date: 03/23/2018 CLINICAL DATA:  Increased shortness of breath since last night. EXAM: PORTABLE CHEST 1 VIEW COMPARISON:  One-view chest x-ray 06/21/2016 FINDINGS: The heart size is normal. The aortic arch is within normal limits. New right middle lobe airspace disease is consistent with pneumonia. Minimal left basilar atelectasis is present. IMPRESSION: 1. Right middle lobe pneumonia. 2. Minimal left basilar atelectasis. Electronically Signed   By: San Morelle M.D.   On: 03/23/2018 10:08   Ct Bone Marrow Biopsy & Aspiration  Result Date: 03/31/2018 INDICATION: 62 year old male with a history of pancytopenia EXAM: CT BONE MARROW BIOPSY AND ASPIRATION MEDICATIONS: None. ANESTHESIA/SEDATION: Moderate (conscious) sedation was employed during this procedure. A total of Versed 2.0 mg and Fentanyl 100 mcg was administered intravenously. Moderate Sedation Time: 10 minutes. The patient's level of consciousness and vital signs were monitored continuously by radiology nursing throughout the procedure under my direct supervision. FLUOROSCOPY TIME:  CT COMPLICATIONS: None PROCEDURE: The procedure risks, benefits, and alternatives were explained to the patient. Questions regarding the procedure  were encouraged and answered. The patient understands and consents to the procedure. Scout CT of the pelvis was performed for surgical planning purposes. The posterior pelvis was prepped with Chlorhexidine in a sterile fashion, and a sterile drape was applied covering the operative field. A sterile gown and sterile gloves were used for the procedure. Local anesthesia was provided with 1% Lidocaine. Posterior iliac bone was targeted for biopsy. The skin and subcutaneous tissues were infiltrated with 1% lidocaine without epinephrine. A small stab incision was made with an 11 blade scalpel, and an 11 gauge Murphy needle was advanced with CT guidance to the posterior cortex. Manual forced was used to advance the needle through the posterior cortex and the stylet was removed. A bone marrow aspirate was retrieved and passed to a cytotechnologist in the room. The Murphy needle was then advanced without the stylet for a core biopsy. The core biopsy was retrieved and also passed to a cytotechnologist. Manual pressure was used for hemostasis and a sterile dressing was placed. No complications were encountered no significant blood loss was encountered. Patient tolerated the procedure well and remained hemodynamically stable throughout. IMPRESSION: Status post CT-guided bone marrow  biopsy, with tissue specimen sent to pathology for complete histopathologic analysis Signed, Dulcy Fanny. Earleen Newport, DO [facility] Electronically Signed   By: Corrie Mckusick D.O.   On: 03/31/2018 10:02    Microbiology: Recent Results (from the past 240 hour(s))  Culture, blood (routine x 2)     Status: None (Preliminary result)   Collection Time: 04/01/18 10:00 AM  Result Value Ref Range Status   Specimen Description BLOOD LEFT ANTECUBITAL  Final   Special Requests   Final    BOTTLES DRAWN AEROBIC ONLY Blood Culture adequate volume   Culture   Final    NO GROWTH 1 DAY Performed at [facility], [address] [place], [place] [address]    Report Status PENDING  Incomplete  Culture, blood (routine x 2)     Status: None (Preliminary result)   Collection Time: 04/01/18 10:10 AM  Result Value Ref Range Status   Specimen Description BLOOD RIGHT ANTECUBITAL  Final   Special Requests   Final    BOTTLES DRAWN AEROBIC ONLY Blood Culture adequate volume   Culture   Final    NO GROWTH 1 DAY Performed at [facility], [place] [address] [place], [place] [address]    Report Status PENDING  Incomplete     Labs: CBC: Recent Labs  Lab 03/27/18 0403 03/28/18 1048 03/30/18 0504 03/31/18 1105 04/01/18 0231 04/02/18 0757  WBC 0.3* 0.4* 0.4* 0.5* 0.8* 0.5*  NEUTROABS 0.2*  --  0.3* 0.4* 0.5* 0.4*  HGB 7.5* 7.6* 7.3* 8.2* 7.8* 7.0*  HCT 23.5* 24.5* 23.5* 26.4* 24.8* 22.5*  MCV 90.4 90.1 89.7 89.8 90.2 91.1  PLT 42* 39* 29* 28* 24* 22*   Basic Metabolic Panel: Recent Labs  Lab 03/27/18 0403 03/30/18 0504 04/01/18 0231 04/02/18 0757  NA 141  --  144 144  K 4.8  --  4.9 4.8  CL 108  --  97* 101  CO2 29  --  43* 37*  GLUCOSE 168*  --  126* 209*  BUN 20  --  23 19  CREATININE 0.65 0.55* 0.79 0.71  CALCIUM 8.5*  --  9.0 8.9  MG  --   --   --  2.4   Liver Function Tests: Recent Labs  Lab 04/02/18 0757  AST 24  ALT 67*  ALKPHOS 47  BILITOT 0.5  PROT 5.8*  ALBUMIN 2.3*   No results for input(s): LIPASE, AMYLASE in the last 168 hours. No results for input(s): AMMONIA in the last 168 hours. Cardiac Enzymes: No results for input(s): CKTOTAL, CKMB, CKMBINDEX, TROPONINI in the last 168 hours. BNP (last 3 results) Recent Labs    04/01/18 1013  BNP 29.2   CBG: No results for input(s): GLUCAP in the last 168 hours. Time spent: 55 minutes  Signed:  Berle Mull  [facility]  04/02/2018  , 7:08 PM

## 2018-04-06 LAB — CULTURE, BLOOD (ROUTINE X 2)
Culture: NO GROWTH
Culture: NO GROWTH
Special Requests: ADEQUATE
Special Requests: ADEQUATE

## 2018-04-07 ENCOUNTER — Ambulatory Visit: Payer: Self-pay | Admitting: Physician Assistant

## 2018-04-08 ENCOUNTER — Encounter (HOSPITAL_COMMUNITY): Payer: Self-pay | Admitting: Hematology

## 2018-05-27 DEATH — deceased

## 2018-11-30 IMAGING — CT CT ANGIO CHEST
2 of 8 series · 19 of 46 positions shown · IV contrast (iopamidol)
Comparison: Chest radiograph April 01, 2018, MRI abdomen Jean-Alexis

CLINICAL DATA: Shortness of breath.  History of lung mass and COPD.

EXAM:
CT ANGIOGRAPHY CHEST WITH CONTRAST
TECHNIQUE: Multidetector CT imaging of the chest was performed using the
standard protocol during bolus administration of intravenous
contrast. Multiplanar CT image reconstructions and MIPs were
obtained to evaluate the vascular anatomy.
CONTRAST:  75mL 6OI7T5-T0I IOPAMIDOL (6OI7T5-T0I) INJECTION 76%

[Series 6: thins · axial · 0.81mm/px · z∈[-502,-186]mm · 16 of 348 slices shown]
[im 16/348  lung]
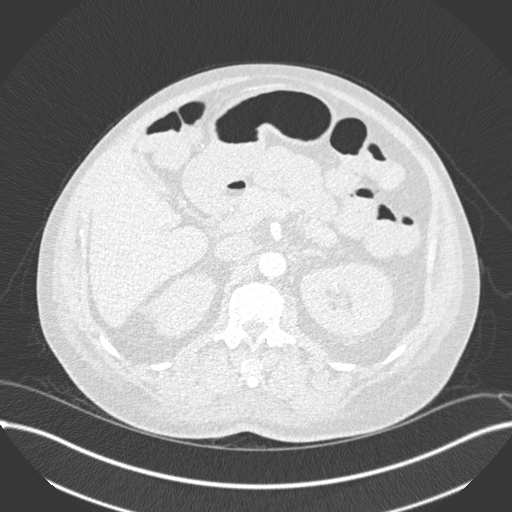
[im 32/348  soft-tissue]
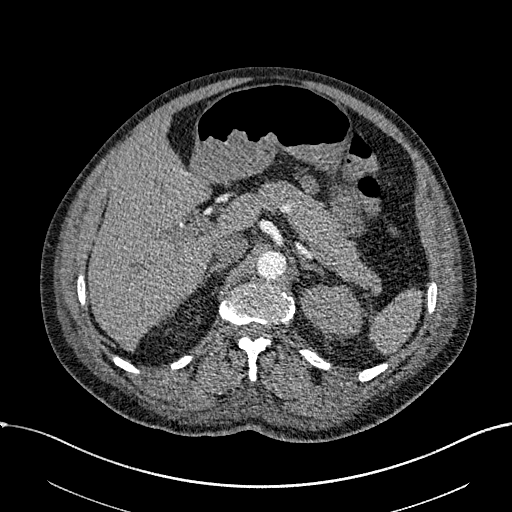
[im 64/348  lung]
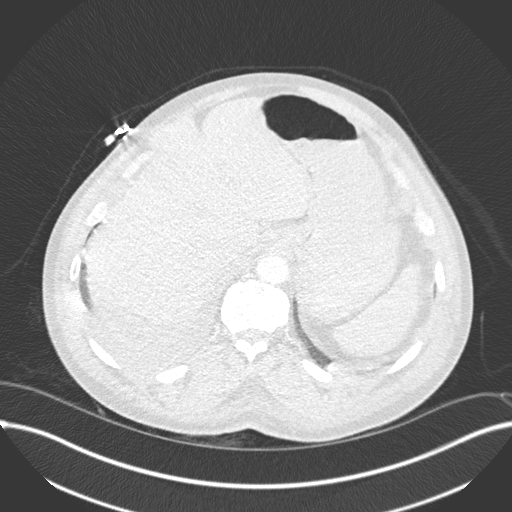
[im 79/348  soft-tissue]
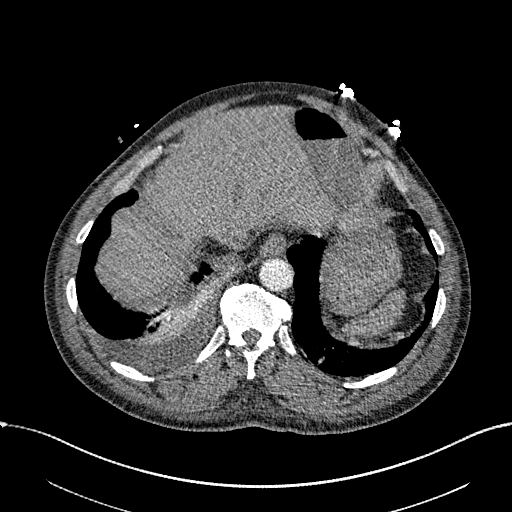
[im 95/348  lung]
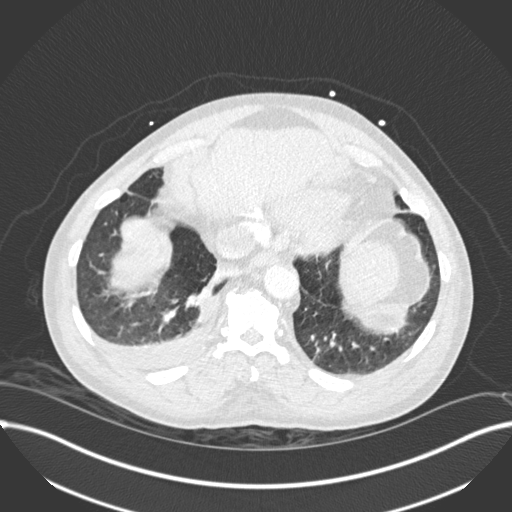
[im 127/348  soft-tissue]
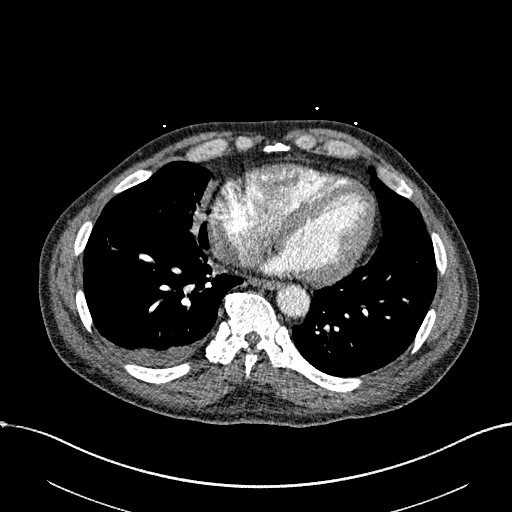
[im 142/348  lung]
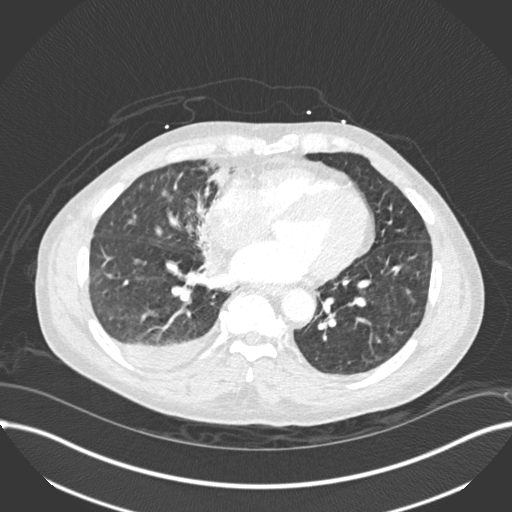
[im 158/348  soft-tissue]
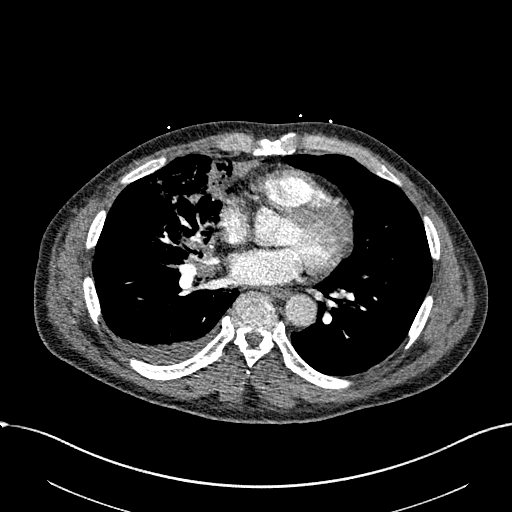
[im 190/348  lung]
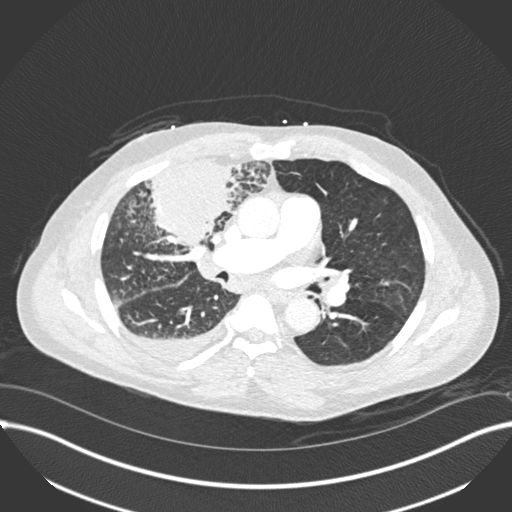
[im 206/348  soft-tissue]
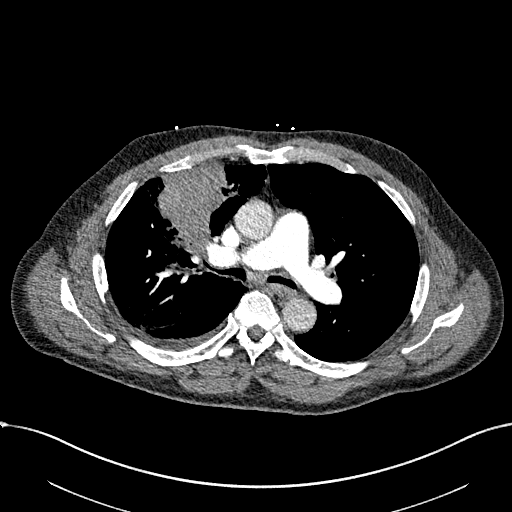
[im 221/348  lung]
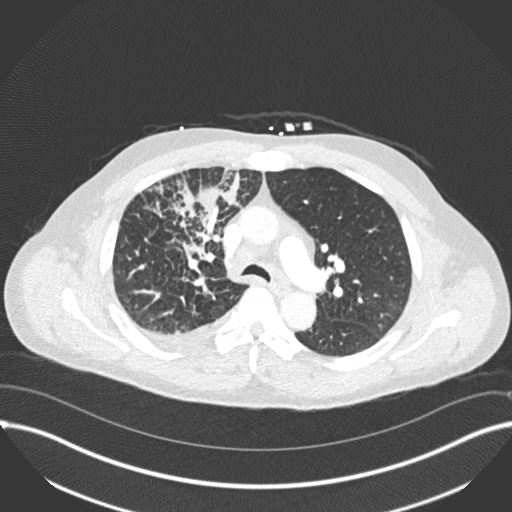
[im 253/348  soft-tissue]
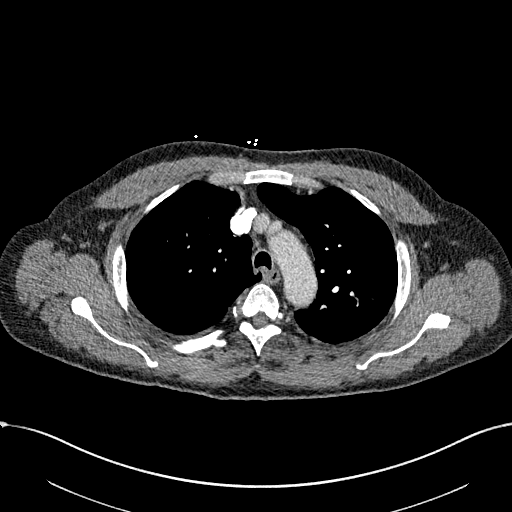
[im 269/348  lung]
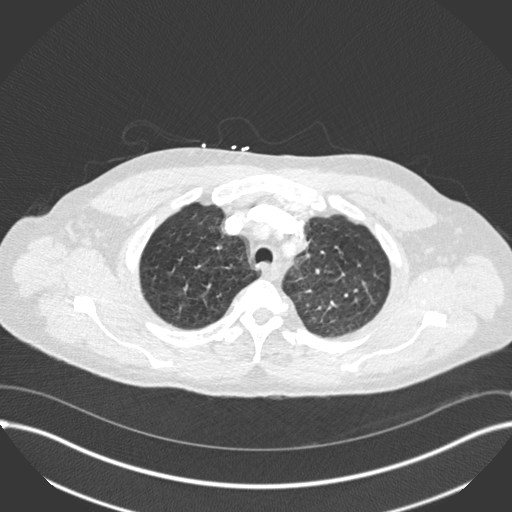
[im 284/348  soft-tissue]
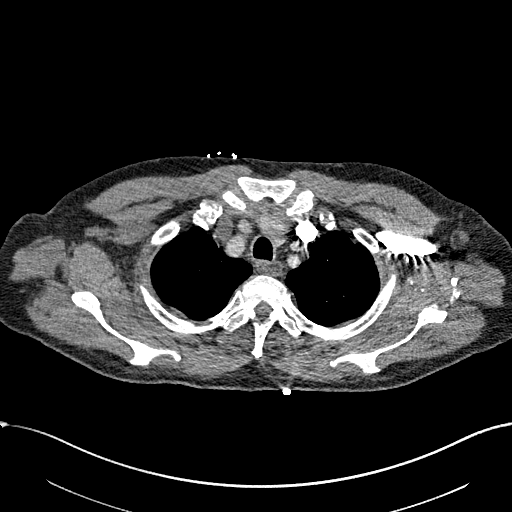
[im 316/348  lung]
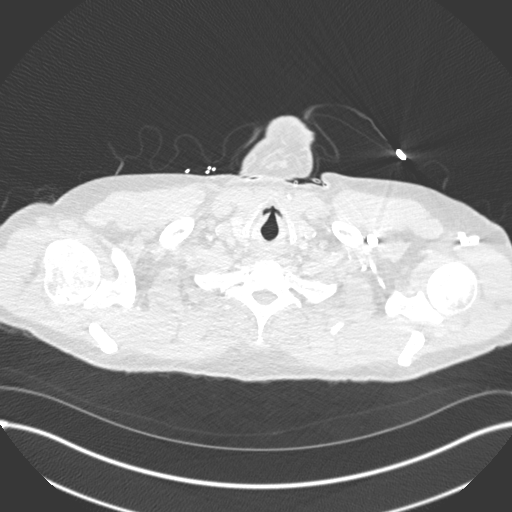
[im 332/348  soft-tissue]
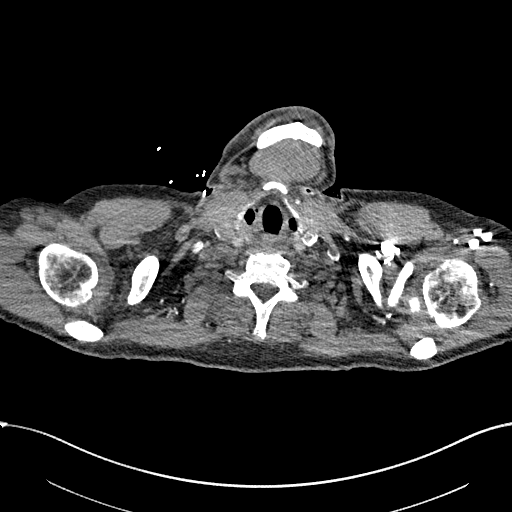

[Series 8: coronal mpr · coronal · 0.70mm/px · 3 of 145 slices shown]
[im 37/145  soft-tissue]
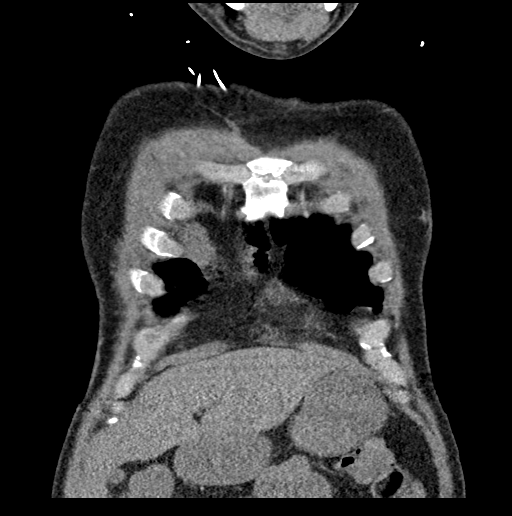
[im 73/145  soft-tissue]
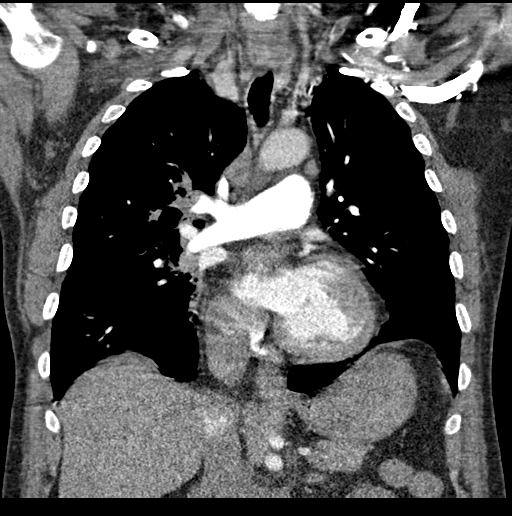
[im 109/145  soft-tissue]
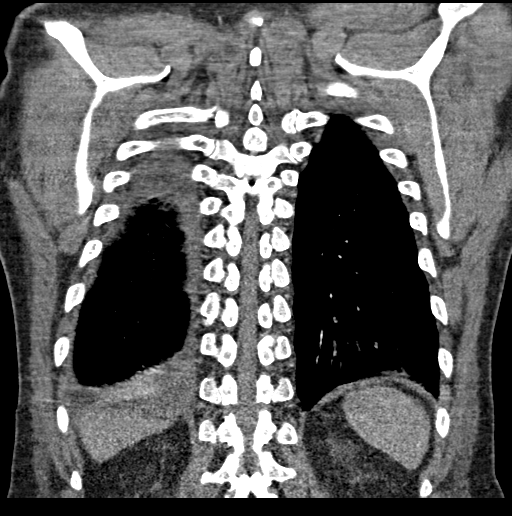

[19 of 46 positions shown; findings below may reference images not displayed]

FINDINGS: CARDIOVASCULAR: Adequate contrast opacification of the pulmonary
artery's. Main pulmonary artery is not enlarged. No pulmonary
arterial filling defects to the level of the subsegmental branches.
Heart size is normal, no right heart strain. No pericardial
effusion. Thoracic aorta is normal course and caliber, unremarkable.

MEDIASTINUM/NODES: 11 mm LEFT paratracheal lymph node. Mild RIGHT
hilar lymphadenopathy.

LUNGS/PLEURA: No pneumothorax. Circumferential soft tissue narrowing
the RIGHT upper and RIGHT middle lobe bronchi. 5.8 x 6 x 6.8 cm
solid RIGHT upper lobe, anterior segment lung mass with surrounding
interlobular septal thickening and centrilobular nodular densities.
No chest wall invasion. Small RIGHT pleural effusion. Mild
centrilobular emphysema.

UPPER ABDOMEN: Non-acute.  Partially imaged LEFT nephrolithiasis.

MUSCULOSKELETAL: Non-acute.

Review of the MIP images confirms the above findings.
IMPRESSION: 1. No acute pulmonary embolism.
2. 5.8 x 6 x 6.8 cm RIGHT upper lobe mass consistent with neoplasm
with surrounding presumed lymphangitic spread of tumor.
3. Mild mediastinal RIGHT hilar lymphadenopathy concerning for
metastatic disease.
4. Soft tissue narrowing the RIGHT middle and RIGHT upper lobe
bronchi, potential endobronchial invasion.
5. Small RIGHT pleural effusion.

Emphysema ([id number]).
